# Patient Record
Sex: Female | Born: 1935 | Race: White | Hispanic: No
Health system: Southern US, Community
[De-identification: ages and names within clinical notes are randomized; demographics above are authoritative.]

## PROBLEM LIST (undated history)

## (undated) DIAGNOSIS — I10 Essential (primary) hypertension: Secondary | ICD-10-CM

## (undated) DIAGNOSIS — R413 Other amnesia: Secondary | ICD-10-CM

## (undated) HISTORY — DX: Other amnesia: R41.3

---

## 1999-01-27 ENCOUNTER — Ambulatory Visit (HOSPITAL_COMMUNITY): Admission: RE | Admit: 1999-01-27 | Discharge: 1999-01-27 | Payer: Self-pay | Admitting: Gastroenterology

## 1999-01-27 ENCOUNTER — Encounter (INDEPENDENT_AMBULATORY_CARE_PROVIDER_SITE_OTHER): Payer: Self-pay | Admitting: Specialist

## 1999-02-20 ENCOUNTER — Encounter: Payer: Self-pay | Admitting: Obstetrics and Gynecology

## 1999-02-20 ENCOUNTER — Encounter: Admission: RE | Admit: 1999-02-20 | Discharge: 1999-02-20 | Payer: Self-pay | Admitting: Obstetrics and Gynecology

## 1999-04-27 ENCOUNTER — Other Ambulatory Visit: Admission: RE | Admit: 1999-04-27 | Discharge: 1999-04-27 | Payer: Self-pay | Admitting: Obstetrics and Gynecology

## 1999-04-27 ENCOUNTER — Encounter (INDEPENDENT_AMBULATORY_CARE_PROVIDER_SITE_OTHER): Payer: Self-pay | Admitting: Specialist

## 1999-08-04 ENCOUNTER — Encounter: Admission: RE | Admit: 1999-08-04 | Discharge: 1999-08-04 | Payer: Self-pay | Admitting: Family Medicine

## 1999-08-04 ENCOUNTER — Encounter: Admission: RE | Admit: 1999-08-04 | Discharge: 1999-08-04 | Payer: Self-pay | Admitting: Obstetrics and Gynecology

## 1999-08-04 ENCOUNTER — Encounter: Payer: Self-pay | Admitting: Obstetrics and Gynecology

## 1999-08-04 ENCOUNTER — Encounter: Payer: Self-pay | Admitting: Family Medicine

## 2000-09-16 ENCOUNTER — Encounter: Payer: Self-pay | Admitting: Obstetrics and Gynecology

## 2000-09-16 ENCOUNTER — Encounter: Admission: RE | Admit: 2000-09-16 | Discharge: 2000-09-16 | Payer: Self-pay | Admitting: Obstetrics and Gynecology

## 2001-09-17 ENCOUNTER — Encounter: Payer: Self-pay | Admitting: Obstetrics and Gynecology

## 2001-09-17 ENCOUNTER — Encounter: Admission: RE | Admit: 2001-09-17 | Discharge: 2001-09-17 | Payer: Self-pay | Admitting: Obstetrics and Gynecology

## 2003-01-06 ENCOUNTER — Encounter: Admission: RE | Admit: 2003-01-06 | Discharge: 2003-01-06 | Payer: Self-pay | Admitting: Obstetrics and Gynecology

## 2003-01-06 ENCOUNTER — Encounter: Payer: Self-pay | Admitting: Obstetrics and Gynecology

## 2004-01-19 ENCOUNTER — Encounter: Admission: RE | Admit: 2004-01-19 | Discharge: 2004-01-19 | Payer: Self-pay | Admitting: Family Medicine

## 2005-04-17 ENCOUNTER — Encounter: Admission: RE | Admit: 2005-04-17 | Discharge: 2005-04-17 | Payer: Self-pay | Admitting: Family Medicine

## 2005-12-13 ENCOUNTER — Encounter: Admission: RE | Admit: 2005-12-13 | Discharge: 2005-12-13 | Payer: Self-pay | Admitting: Family Medicine

## 2006-06-20 ENCOUNTER — Encounter: Admission: RE | Admit: 2006-06-20 | Discharge: 2006-06-20 | Payer: Self-pay | Admitting: Family Medicine

## 2007-10-23 ENCOUNTER — Encounter: Admission: RE | Admit: 2007-10-23 | Discharge: 2007-10-23 | Payer: Self-pay | Admitting: Family Medicine

## 2009-01-27 ENCOUNTER — Encounter: Admission: RE | Admit: 2009-01-27 | Discharge: 2009-01-27 | Payer: Self-pay | Admitting: Family Medicine

## 2010-02-02 ENCOUNTER — Encounter: Admission: RE | Admit: 2010-02-02 | Discharge: 2010-02-02 | Payer: Self-pay | Admitting: Family Medicine

## 2011-05-16 ENCOUNTER — Other Ambulatory Visit: Payer: Self-pay | Admitting: Family Medicine

## 2011-05-16 DIAGNOSIS — Z1231 Encounter for screening mammogram for malignant neoplasm of breast: Secondary | ICD-10-CM

## 2011-05-31 ENCOUNTER — Ambulatory Visit
Admission: RE | Admit: 2011-05-31 | Discharge: 2011-05-31 | Disposition: A | Discharge status: Home / Self Care | Payer: Medicare Other | Source: Ambulatory Visit | Attending: Family Medicine | Admitting: Family Medicine

## 2011-05-31 DIAGNOSIS — Z1231 Encounter for screening mammogram for malignant neoplasm of breast: Secondary | ICD-10-CM

## 2013-02-13 ENCOUNTER — Other Ambulatory Visit: Payer: Self-pay

## 2013-02-13 DIAGNOSIS — Z1231 Encounter for screening mammogram for malignant neoplasm of breast: Secondary | ICD-10-CM

## 2013-03-20 ENCOUNTER — Ambulatory Visit
Admission: RE | Admit: 2013-03-20 | Discharge: 2013-03-20 | Disposition: A | Discharge status: Home / Self Care | Payer: Medicare Other | Source: Ambulatory Visit

## 2013-03-20 DIAGNOSIS — Z1231 Encounter for screening mammogram for malignant neoplasm of breast: Secondary | ICD-10-CM

## 2014-04-20 ENCOUNTER — Other Ambulatory Visit: Payer: Self-pay | Admitting: Family Medicine

## 2014-04-20 DIAGNOSIS — Z1231 Encounter for screening mammogram for malignant neoplasm of breast: Secondary | ICD-10-CM

## 2014-04-20 DIAGNOSIS — M810 Age-related osteoporosis without current pathological fracture: Secondary | ICD-10-CM

## 2014-04-29 ENCOUNTER — Other Ambulatory Visit: Payer: Medicare Other

## 2014-04-29 ENCOUNTER — Ambulatory Visit: Payer: Medicare Other

## 2014-05-11 ENCOUNTER — Ambulatory Visit: Payer: Medicare Other

## 2014-05-11 ENCOUNTER — Other Ambulatory Visit: Payer: Medicare Other

## 2014-05-25 ENCOUNTER — Ambulatory Visit: Payer: Medicare Other

## 2014-05-25 ENCOUNTER — Other Ambulatory Visit: Payer: Medicare Other

## 2014-09-29 ENCOUNTER — Ambulatory Visit
Admission: RE | Admit: 2014-09-29 | Discharge: 2014-09-29 | Disposition: A | Discharge status: Home / Self Care | Payer: Medicare HMO | Source: Ambulatory Visit | Attending: Family Medicine | Admitting: Family Medicine

## 2014-09-29 DIAGNOSIS — Z1231 Encounter for screening mammogram for malignant neoplasm of breast: Secondary | ICD-10-CM

## 2014-09-29 DIAGNOSIS — M810 Age-related osteoporosis without current pathological fracture: Secondary | ICD-10-CM

## 2016-09-17 ENCOUNTER — Emergency Department (HOSPITAL_COMMUNITY): Payer: Medicare HMO

## 2016-09-17 ENCOUNTER — Encounter (HOSPITAL_COMMUNITY): Payer: Self-pay | Admitting: Emergency Medicine

## 2016-09-17 ENCOUNTER — Inpatient Hospital Stay (HOSPITAL_COMMUNITY)
Admission: EM | Admit: 2016-09-17 | Discharge: 2016-09-20 | DRG: 481 | Disposition: A | Discharge status: Skilled Nursing Facility | Payer: Medicare HMO | Attending: Internal Medicine | Admitting: Internal Medicine

## 2016-09-17 DIAGNOSIS — M25552 Pain in left hip: Secondary | ICD-10-CM

## 2016-09-17 DIAGNOSIS — I10 Essential (primary) hypertension: Secondary | ICD-10-CM | POA: Diagnosis present

## 2016-09-17 DIAGNOSIS — Z01811 Encounter for preprocedural respiratory examination: Secondary | ICD-10-CM

## 2016-09-17 DIAGNOSIS — Z09 Encounter for follow-up examination after completed treatment for conditions other than malignant neoplasm: Secondary | ICD-10-CM

## 2016-09-17 DIAGNOSIS — S72002A Fracture of unspecified part of neck of left femur, initial encounter for closed fracture: Secondary | ICD-10-CM

## 2016-09-17 DIAGNOSIS — Z8249 Family history of ischemic heart disease and other diseases of the circulatory system: Secondary | ICD-10-CM

## 2016-09-17 DIAGNOSIS — K5900 Constipation, unspecified: Secondary | ICD-10-CM | POA: Diagnosis present

## 2016-09-17 DIAGNOSIS — D649 Anemia, unspecified: Secondary | ICD-10-CM | POA: Diagnosis present

## 2016-09-17 DIAGNOSIS — W010XXA Fall on same level from slipping, tripping and stumbling without subsequent striking against object, initial encounter: Secondary | ICD-10-CM | POA: Diagnosis present

## 2016-09-17 DIAGNOSIS — Y93H2 Activity, gardening and landscaping: Secondary | ICD-10-CM

## 2016-09-17 DIAGNOSIS — S72142A Displaced intertrochanteric fracture of left femur, initial encounter for closed fracture: Secondary | ICD-10-CM | POA: Diagnosis not present

## 2016-09-17 DIAGNOSIS — Y92007 Garden or yard of unspecified non-institutional (private) residence as the place of occurrence of the external cause: Secondary | ICD-10-CM

## 2016-09-17 DIAGNOSIS — D62 Acute posthemorrhagic anemia: Secondary | ICD-10-CM | POA: Diagnosis not present

## 2016-09-17 HISTORY — DX: Essential (primary) hypertension: I10

## 2016-09-17 LAB — COMPREHENSIVE METABOLIC PANEL
ALBUMIN: 4 g/dL (ref 3.5–5.0)
ALK PHOS: 84 U/L (ref 38–126)
ALT: 24 U/L (ref 14–54)
ANION GAP: 6 (ref 5–15)
AST: 30 U/L (ref 15–41)
BILIRUBIN TOTAL: 0.2 mg/dL — AB (ref 0.3–1.2)
BUN: 12 mg/dL (ref 6–20)
CALCIUM: 8.9 mg/dL (ref 8.9–10.3)
CO2: 25 mmol/L (ref 22–32)
CREATININE: 0.7 mg/dL (ref 0.44–1.00)
Chloride: 108 mmol/L (ref 101–111)
GFR calc non Af Amer: >60 mL/min (ref >60)
GLUCOSE: 137 mg/dL — AB (ref 65–99)
Potassium: 3.8 mmol/L (ref 3.5–5.1)
Sodium: 139 mmol/L (ref 135–145)
TOTAL PROTEIN: 7.5 g/dL (ref 6.5–8.1)

## 2016-09-17 LAB — CBC WITH DIFFERENTIAL/PLATELET
BASOS ABS: 0 10*3/uL (ref 0.0–0.1)
BASOS PCT: 0 %
EOS ABS: 0 10*3/uL (ref 0.0–0.7)
EOS PCT: 0 %
HEMATOCRIT: 41.7 % (ref 36.0–46.0)
Hemoglobin: 14.6 g/dL (ref 12.0–15.0)
Lymphocytes Relative: 4 %
Lymphs Abs: 0.9 10*3/uL (ref 0.7–4.0)
MCH: 32.9 pg (ref 26.0–34.0)
MCHC: 35 g/dL (ref 30.0–36.0)
MCV: 93.9 fL (ref 78.0–100.0)
MONO ABS: 1.5 10*3/uL — AB (ref 0.1–1.0)
Monocytes Relative: 7 %
NEUTROS ABS: 19.1 10*3/uL — AB (ref 1.7–7.7)
Neutrophils Relative %: 89 %
PLATELETS: UNDETERMINED 10*3/uL (ref 150–400)
RBC: 4.44 MIL/uL (ref 3.87–5.11)
RDW: 12.5 % (ref 11.5–15.5)
WBC: 21.5 10*3/uL — ABNORMAL HIGH (ref 4.0–10.5)

## 2016-09-17 MED ORDER — MORPHINE SULFATE (PF) 2 MG/ML IV SOLN
4.0000 mg | Freq: Once | INTRAVENOUS | Status: AC
  Administered 2016-09-17: 4 mg via INTRAVENOUS
  Filled 2016-09-17: qty 2

## 2016-09-17 NOTE — ED Notes (Signed)
Patient transported to X-ray 

## 2016-09-17 NOTE — ED Provider Notes (Addendum)
[facility]-EMERGENCY DEPT Provider Note   CSN: [id number] Arrival date & time: 09/17/16  2014     History   Chief Complaint Chief Complaint  Patient presents with  . Hip Injury    left    HPI Kelly [name] is a 82 y.o. female.  She presents for evaluation of left hip pain following fall.  She describes working outside in her garden, stepping on something that she did not see, then falling injuring her left hip.  Follow the fall she was unable to get up.  She denies other injuries.  There were no preceding symptoms.  She denies recent illnesses.  There are no other known modifying factors.  HPI  Past Medical History:  Diagnosis Date  . Hypertension     There are no active problems to display for this patient.   History reviewed. No pertinent surgical history.  OB History    No data available       Home Medications    Prior to Admission medications   Medication Sig Start Date End Date Taking? Authorizing Provider  Ascorbic Acid (VITAMIN C) 100 MG tablet Take 100 mg by mouth daily.    Yes [provider]  cholecalciferol (VITAMIN D) 1000 units tablet Take 1,000 Units by mouth daily.   Yes [provider]  valsartan (DIOVAN) 80 MG tablet TAKE 1 TABLET (80 MG TOTAL) BY MOUTH DAILY. 08/04/16  Yes [provider]    Family History No family history on file.  Social History Social History  Substance Use Topics  . Smoking status: Passive Smoke Exposure - Never Smoker  . Smokeless tobacco: Never Used  . Alcohol use No     Allergies   Patient has no known allergies.   Review of Systems Review of Systems  All other systems reviewed and are negative.    Physical Exam Updated Vital Signs BP (!) 141/78   Pulse 66   Temp 97.7 F (36.5 C) (Oral)   Resp 11   Ht 5\' 6"  (1.676 m)   Wt 63.5 kg (140 lb)   SpO2 91%   BMI 22.60 kg/m   Physical Exam  Constitutional: She is oriented to person, place, and time. She appears well-developed.  No distress.  Elderly, frail  HENT:  Head: Normocephalic and atraumatic.  Eyes: Conjunctivae and EOM are normal. Pupils are equal, round, and reactive to light.  Neck: Normal range of motion and phonation normal. Neck supple.  Cardiovascular: Normal rate and regular rhythm.   Pulmonary/Chest: Effort normal and breath sounds normal. She exhibits no tenderness.  Abdominal: Soft. She exhibits no distension. There is no tenderness. There is no guarding.  Musculoskeletal:  Left leg externally rotated and shortened.  Tender left hip, resisting motion at the site secondary to pain.  Neurological: She is alert and oriented to person, place, and time. She exhibits normal muscle tone.  Skin: Skin is warm and dry.  Psychiatric: She has a normal mood and affect. Her behavior is normal. Judgment and thought content normal.  Nursing note and vitals reviewed.    ED Treatments / Results  Labs (all labs ordered are listed, but only abnormal results are displayed) Labs Reviewed  COMPREHENSIVE METABOLIC PANEL - Abnormal; Notable for the following:       Result Value   Glucose, Bld 137 (*)    Total Bilirubin 0.2 (*)    All other components within normal limits  CBC WITH DIFFERENTIAL/PLATELET - Abnormal; Notable for the following:    WBC  21.5 (*)    Neutro Abs 19.1 (*)    Monocytes Absolute 1.5 (*)    All other components within normal limits    EKG  EKG Interpretation  Date/Time:  Monday September 17 2016 22:22:32 EDT Ventricular Rate:  60 PR Interval:    QRS Duration: 134 QT Interval:  439 QTC Calculation: 439 R Axis:   23 Text Interpretation:  Sinus rhythm Right bundle branch block No old tracing to compare Confirmed by Mancel Bale [phone]) on 09/18/2016 12:00:51 AM       Radiology Dg Chest 1 View  Result Date: 09/17/2016 CLINICAL DATA:  Fall with hip fracture EXAM: CHEST 1 VIEW COMPARISON:  None. FINDINGS: No acute pulmonary infiltrate, consolidation or effusion. Heart size upper  limits normal. Mild aortic atherosclerosis. No pneumothorax. IMPRESSION: No active disease. Electronically Signed   By: Jasmine Pang M.D.   On: 09/17/2016 21:22   Dg Hip Unilat With Pelvis 2-3 Views Left  Result Date: 09/17/2016 CLINICAL DATA:  Fall with pain to the hip EXAM: DG HIP (WITH OR WITHOUT PELVIS) 2-3V LEFT COMPARISON:  None. FINDINGS: Sclerosis of the pubic symphysis. SI joints do not appear widened. Mild arthritis right hip. The pubic rami appear intact. Comminuted intertrochanteric fracture on the left with centrally displaced lesser trochanteric fracture fragment toward the midline. Femoral head is not dislocated. IMPRESSION: Acute, comminuted and slightly distracted intertrochanteric fracture on the left. Electronically Signed   By: Jasmine Pang M.D.   On: 09/17/2016 21:22   Dg Femur Min 2 Views Left  Result Date: 09/17/2016 CLINICAL DATA:  Status post fall at home, with left hip pain. Initial encounter. EXAM: LEFT FEMUR 2 VIEWS COMPARISON:  None. FINDINGS: There is a comminuted left femoral intertrochanteric fracture, with a displaced lesser trochanteric fragment, and medial angulation of the distal femur. The left femoral head remains seated at the acetabulum. Mild degenerative change is noted at the pubic symphysis. The distal femur appears intact. No definite knee joint effusion is identified. IMPRESSION: Comminuted left femoral intertrochanteric fracture, with displaced lesser trochanteric fragment, and medial angulation of the distal femur. Electronically Signed   By: Roanna Raider M.D.   On: 09/17/2016 23:28    Procedures Procedures (including critical care time)  Medications Ordered in ED Medications  morphine 2 MG/ML injection 4 mg (4 mg Intravenous Given 09/17/16 2042)     Initial Impression / Assessment and Plan / ED Course  I have reviewed the triage vital signs and the nursing notes.  Pertinent labs & imaging results that were available during my care of the  patient were reviewed by me and considered in my medical decision making (see chart for details).  Clinical Course as of Jun 12 0001  Mon Sep 17, 2016  2140 DG Hip Unilat With Pelvis 2-3 Views Left [EW]  2305 Return call received from orthopedics Veda Canning), who will evaluate the patient, tomorrow and likely seed with operative repair of left hip fracture.  [EW]    Clinical Course User Index [EW] Mancel Bale, MD     Patient Vitals for the past 24 hrs:  BP Temp Temp src Pulse Resp SpO2 Height Weight  09/17/16 2330 (!) 141/78 - - 66 11 91 % - -  09/17/16 2300 124/67 - - 63 17 92 % - -  09/17/16 2230 135/73 - - 60 (!) 22 93 % - -  09/17/16 2200 135/69 - - 63 (!) 27 90 % - -  09/17/16 2145 (!) 147/71 - - 62 14  95 % - -  09/17/16 2141 140/68 - - 62 18 95 % - -  09/17/16 2023 - - - - - - 5\' 6"  (1.676 m) 63.5 kg (140 lb)  09/17/16 2022 (!) 148/84 97.7 F (36.5 C) Oral 60 16 95 % - -    11:05 PM Reevaluation with update and discussion. After initial assessment and treatment, an updated evaluation reveals she remains fairly comfortable at this time.  Repeat x-rays ordered at request of orthopedist. Enza Shone L   11:06 PM-Consult complete with hospitalist. Patient case explained and discussed.  He agrees to admit patient for further evaluation and treatment. Call ended at 23: 20  Final Clinical Impressions(s) / ED Diagnoses   Final diagnoses:  Closed fracture of left hip, initial encounter (HCC)   Left hip fracture secondary to mechanical fall.  She will require hospitalization for operative management of the fracture.  Nursing Notes Reviewed/ Care Coordinated Applicable Imaging Reviewed Interpretation of Laboratory Data incorporated into ED treatment   Plan: Admit   New Prescriptions New Prescriptions   No medications on file     Mancel BaleWentz, Jakalyn Kratky, MD 09/17/16 [id number]    Mancel BaleWentz, Latha Staunton, MD 09/18/16 0001

## 2016-09-17 NOTE — ED Triage Notes (Addendum)
Per [facility] pt from home, was working in her garden and tripped. Pt c/o left hip pain. Shortening noted to left leg, pt unable to lay leg flat. Sheet used to hold pelvis tight. 100 mcg fentanyl given en route.

## 2016-09-18 ENCOUNTER — Inpatient Hospital Stay (HOSPITAL_COMMUNITY): Payer: Medicare HMO | Admitting: Anesthesiology

## 2016-09-18 ENCOUNTER — Inpatient Hospital Stay (HOSPITAL_COMMUNITY): Payer: Medicare HMO

## 2016-09-18 ENCOUNTER — Encounter (HOSPITAL_COMMUNITY): Payer: Self-pay | Admitting: Internal Medicine

## 2016-09-18 ENCOUNTER — Encounter (HOSPITAL_COMMUNITY)
Admission: EM | Disposition: A | Discharge status: Skilled Nursing Facility | Payer: Self-pay | Source: Home / Self Care | Attending: Internal Medicine

## 2016-09-18 DIAGNOSIS — W010XXA Fall on same level from slipping, tripping and stumbling without subsequent striking against object, initial encounter: Secondary | ICD-10-CM | POA: Diagnosis present

## 2016-09-18 DIAGNOSIS — S72142A Displaced intertrochanteric fracture of left femur, initial encounter for closed fracture: Secondary | ICD-10-CM | POA: Diagnosis present

## 2016-09-18 DIAGNOSIS — K5900 Constipation, unspecified: Secondary | ICD-10-CM | POA: Diagnosis not present

## 2016-09-18 DIAGNOSIS — D62 Acute posthemorrhagic anemia: Secondary | ICD-10-CM | POA: Diagnosis not present

## 2016-09-18 DIAGNOSIS — I10 Essential (primary) hypertension: Secondary | ICD-10-CM | POA: Diagnosis not present

## 2016-09-18 DIAGNOSIS — Y93H2 Activity, gardening and landscaping: Secondary | ICD-10-CM | POA: Diagnosis not present

## 2016-09-18 DIAGNOSIS — S72002A Fracture of unspecified part of neck of left femur, initial encounter for closed fracture: Secondary | ICD-10-CM | POA: Diagnosis not present

## 2016-09-18 DIAGNOSIS — D649 Anemia, unspecified: Secondary | ICD-10-CM | POA: Diagnosis not present

## 2016-09-18 DIAGNOSIS — Z8249 Family history of ischemic heart disease and other diseases of the circulatory system: Secondary | ICD-10-CM | POA: Diagnosis not present

## 2016-09-18 DIAGNOSIS — M25552 Pain in left hip: Secondary | ICD-10-CM | POA: Diagnosis not present

## 2016-09-18 DIAGNOSIS — Y92007 Garden or yard of unspecified non-institutional (private) residence as the place of occurrence of the external cause: Secondary | ICD-10-CM | POA: Diagnosis not present

## 2016-09-18 HISTORY — PX: INTRAMEDULLARY (IM) NAIL INTERTROCHANTERIC: SHX5875

## 2016-09-18 LAB — SURGICAL PCR SCREEN
MRSA, PCR: NEGATIVE
STAPHYLOCOCCUS AUREUS: NEGATIVE

## 2016-09-18 SURGERY — FIXATION, FRACTURE, INTERTROCHANTERIC, WITH INTRAMEDULLARY ROD
Anesthesia: General | Site: Hip | Laterality: Left

## 2016-09-18 MED ORDER — ISOPROPYL ALCOHOL 70 % SOLN
Status: DC | PRN
  Administered 2016-09-18: 1 via TOPICAL

## 2016-09-18 MED ORDER — ONDANSETRON HCL 4 MG PO TABS: 4.0000 mg | ORAL_TABLET | Freq: Four times a day (QID) | ORAL | Status: DC | PRN

## 2016-09-18 MED ORDER — POVIDONE-IODINE 10 % EX SWAB: 2.0000 "application " | Freq: Once | CUTANEOUS | Status: DC

## 2016-09-18 MED ORDER — ENOXAPARIN SODIUM 40 MG/0.4ML ~~LOC~~ SOLN
40.0000 mg | SUBCUTANEOUS | Status: DC
  Administered 2016-09-19 – 2016-09-20 (×2): 40 mg via SUBCUTANEOUS
  Filled 2016-09-18 (×2): qty 0.4

## 2016-09-18 MED ORDER — PHENOL 1.4 % MT LIQD: 1.0000 | OROMUCOSAL | Status: DC | PRN

## 2016-09-18 MED ORDER — VITAMIN D3 25 MCG (1000 UNIT) PO TABS
1000.0000 [IU] | ORAL_TABLET | Freq: Every day | ORAL | Status: DC
  Administered 2016-09-18 – 2016-09-20 (×3): 1000 [IU] via ORAL
  Filled 2016-09-18 (×3): qty 1

## 2016-09-18 MED ORDER — ALUM & MAG HYDROXIDE-SIMETH 200-200-20 MG/5ML PO SUSP: 15.0000 mL | ORAL | Status: DC | PRN

## 2016-09-18 MED ORDER — FENTANYL CITRATE (PF) 250 MCG/5ML IJ SOLN
INTRAMUSCULAR | Status: AC
  Filled 2016-09-18: qty 5

## 2016-09-18 MED ORDER — PROMETHAZINE HCL 25 MG/ML IJ SOLN: 6.2500 mg | INTRAMUSCULAR | Status: DC | PRN

## 2016-09-18 MED ORDER — MORPHINE SULFATE (PF) 2 MG/ML IV SOLN: 0.5000 mg | INTRAVENOUS | Status: DC | PRN

## 2016-09-18 MED ORDER — CEFAZOLIN SODIUM-DEXTROSE 2-4 GM/100ML-% IV SOLN
INTRAVENOUS | Status: AC
Start: 2016-09-18 — End: 2016-09-18
  Filled 2016-09-18: qty 100

## 2016-09-18 MED ORDER — HYDROMORPHONE HCL 1 MG/ML IJ SOLN
0.2500 mg | INTRAMUSCULAR | Status: DC | PRN
  Administered 2016-09-18 (×4): 0.5 mg via INTRAVENOUS

## 2016-09-18 MED ORDER — METOCLOPRAMIDE HCL 5 MG/ML IJ SOLN: 5.0000 mg | Freq: Three times a day (TID) | INTRAMUSCULAR | Status: DC | PRN

## 2016-09-18 MED ORDER — VITAMIN C 250 MG PO TABS
250.0000 mg | ORAL_TABLET | Freq: Every day | ORAL | Status: DC
  Administered 2016-09-18 – 2016-09-20 (×3): 250 mg via ORAL
  Filled 2016-09-18 (×3): qty 1

## 2016-09-18 MED ORDER — HYDROCODONE-ACETAMINOPHEN 5-325 MG PO TABS
1.0000 | ORAL_TABLET | Freq: Four times a day (QID) | ORAL | Status: DC | PRN
  Administered 2016-09-18: 1 via ORAL
  Administered 2016-09-19 (×3): 2 via ORAL
  Administered 2016-09-19: 05:00:00 1 via ORAL
  Administered 2016-09-20: 05:00:00 2 via ORAL
  Filled 2016-09-18 (×2): qty 2
  Filled 2016-09-18: qty 1
  Filled 2016-09-18 (×2): qty 2
  Filled 2016-09-18: qty 1

## 2016-09-18 MED ORDER — ACETAMINOPHEN 325 MG PO TABS: 650.0000 mg | ORAL_TABLET | Freq: Four times a day (QID) | ORAL | Status: DC | PRN

## 2016-09-18 MED ORDER — LIDOCAINE 2% (20 MG/ML) 5 ML SYRINGE
INTRAMUSCULAR | Status: DC | PRN
  Administered 2016-09-18: 60 mg via INTRAVENOUS

## 2016-09-18 MED ORDER — METOCLOPRAMIDE HCL 5 MG PO TABS: 5.0000 mg | ORAL_TABLET | Freq: Three times a day (TID) | ORAL | Status: DC | PRN

## 2016-09-18 MED ORDER — HYDROCODONE-ACETAMINOPHEN 5-325 MG PO TABS: 1.0000 | ORAL_TABLET | Freq: Four times a day (QID) | ORAL | Status: DC | PRN

## 2016-09-18 MED ORDER — ONDANSETRON HCL 4 MG/2ML IJ SOLN
INTRAMUSCULAR | Status: DC | PRN
  Administered 2016-09-18: 4 mg via INTRAVENOUS

## 2016-09-18 MED ORDER — MORPHINE SULFATE (PF) 2 MG/ML IV SOLN
0.5000 mg | INTRAVENOUS | Status: DC | PRN
Start: 2016-09-18 — End: 2016-09-20

## 2016-09-18 MED ORDER — SUGAMMADEX SODIUM 200 MG/2ML IV SOLN
INTRAVENOUS | Status: AC
  Filled 2016-09-18: qty 2

## 2016-09-18 MED ORDER — MORPHINE SULFATE (PF) 2 MG/ML IV SOLN
2.0000 mg | INTRAVENOUS | Status: DC | PRN
  Administered 2016-09-18 (×2): 4 mg via INTRAVENOUS
  Filled 2016-09-18 (×2): qty 2

## 2016-09-18 MED ORDER — IRBESARTAN 75 MG PO TABS
75.0000 mg | ORAL_TABLET | Freq: Every day | ORAL | Status: DC
  Administered 2016-09-19 – 2016-09-20 (×2): 75 mg via ORAL
  Filled 2016-09-18 (×2): qty 1

## 2016-09-18 MED ORDER — PROPOFOL 10 MG/ML IV BOLUS
INTRAVENOUS | Status: DC | PRN
  Administered 2016-09-18: 130 mg via INTRAVENOUS

## 2016-09-18 MED ORDER — CHLORHEXIDINE GLUCONATE 4 % EX LIQD: 60.0000 mL | Freq: Once | CUTANEOUS | Status: DC

## 2016-09-18 MED ORDER — PROPOFOL 10 MG/ML IV BOLUS
INTRAVENOUS | Status: AC
  Filled 2016-09-18: qty 20

## 2016-09-18 MED ORDER — CEFAZOLIN SODIUM-DEXTROSE 2-4 GM/100ML-% IV SOLN
2.0000 g | INTRAVENOUS | Status: AC
  Administered 2016-09-18: 2 g via INTRAVENOUS

## 2016-09-18 MED ORDER — ONDANSETRON HCL 4 MG/2ML IJ SOLN: 4.0000 mg | Freq: Four times a day (QID) | INTRAMUSCULAR | Status: DC | PRN

## 2016-09-18 MED ORDER — FENTANYL CITRATE (PF) 100 MCG/2ML IJ SOLN
INTRAMUSCULAR | Status: DC | PRN
  Administered 2016-09-18 (×2): 25 ug via INTRAVENOUS
  Administered 2016-09-18 (×2): 50 ug via INTRAVENOUS

## 2016-09-18 MED ORDER — DEXAMETHASONE SODIUM PHOSPHATE 10 MG/ML IJ SOLN
INTRAMUSCULAR | Status: AC
  Filled 2016-09-18: qty 1

## 2016-09-18 MED ORDER — STERILE WATER FOR IRRIGATION IR SOLN
Status: DC | PRN
  Administered 2016-09-18: 2000 mL

## 2016-09-18 MED ORDER — HYDROMORPHONE HCL 1 MG/ML IJ SOLN
INTRAMUSCULAR | Status: AC
  Filled 2016-09-18: qty 2

## 2016-09-18 MED ORDER — ACETAMINOPHEN 650 MG RE SUPP: 650.0000 mg | Freq: Four times a day (QID) | RECTAL | Status: DC | PRN

## 2016-09-18 MED ORDER — DEXAMETHASONE SODIUM PHOSPHATE 10 MG/ML IJ SOLN
INTRAMUSCULAR | Status: DC | PRN
  Administered 2016-09-18: 10 mg via INTRAVENOUS

## 2016-09-18 MED ORDER — ONDANSETRON HCL 4 MG/2ML IJ SOLN
INTRAMUSCULAR | Status: AC
  Filled 2016-09-18: qty 2

## 2016-09-18 MED ORDER — LIDOCAINE 2% (20 MG/ML) 5 ML SYRINGE
INTRAMUSCULAR | Status: AC
  Filled 2016-09-18: qty 5

## 2016-09-18 MED ORDER — SODIUM CHLORIDE 0.9 % IV SOLN
INTRAVENOUS | Status: DC
  Administered 2016-09-18 – 2016-09-19 (×2): via INTRAVENOUS

## 2016-09-18 MED ORDER — ROCURONIUM BROMIDE 10 MG/ML (PF) SYRINGE
PREFILLED_SYRINGE | INTRAVENOUS | Status: DC | PRN
  Administered 2016-09-18: 50 mg via INTRAVENOUS

## 2016-09-18 MED ORDER — ROCURONIUM BROMIDE 50 MG/5ML IV SOSY
PREFILLED_SYRINGE | INTRAVENOUS | Status: AC
  Filled 2016-09-18: qty 5

## 2016-09-18 MED ORDER — SUGAMMADEX SODIUM 200 MG/2ML IV SOLN
INTRAVENOUS | Status: DC | PRN
  Administered 2016-09-18: 150 mg via INTRAVENOUS

## 2016-09-18 MED ORDER — MENTHOL 3 MG MT LOZG: 1.0000 | LOZENGE | OROMUCOSAL | Status: DC | PRN

## 2016-09-18 MED ORDER — SODIUM CHLORIDE 0.9 % IR SOLN
Status: DC | PRN
  Administered 2016-09-18: 1000 mL

## 2016-09-18 MED ORDER — POLYETHYLENE GLYCOL 3350 17 G PO PACK
17.0000 g | PACK | Freq: Every day | ORAL | Status: DC
  Administered 2016-09-19 – 2016-09-20 (×2): 17 g via ORAL
  Filled 2016-09-18 (×2): qty 1

## 2016-09-18 MED ORDER — LACTATED RINGERS IV SOLN
INTRAVENOUS | Status: DC
  Administered 2016-09-18 (×2): via INTRAVENOUS

## 2016-09-18 SURGICAL SUPPLY — 44 items
ADH SKN CLS APL DERMABOND .7 (GAUZE/BANDAGES/DRESSINGS) ×2
BAG SPEC THK2 15X12 ZIP CLS (MISCELLANEOUS)
BAG ZIPLOCK 12X15 (MISCELLANEOUS) IMPLANT
BIT DRILL 4.3MMS DISTAL GRDTED (BIT) IMPLANT
CHLORAPREP W/TINT 26ML (MISCELLANEOUS) ×3 IMPLANT
COVER PERINEAL POST (MISCELLANEOUS) ×3 IMPLANT
COVER SURGICAL LIGHT HANDLE (MISCELLANEOUS) ×3 IMPLANT
DERMABOND ADVANCED (GAUZE/BANDAGES/DRESSINGS) ×4
DERMABOND ADVANCED .7 DNX12 (GAUZE/BANDAGES/DRESSINGS) ×2 IMPLANT
DRAPE C-ARM 42X120 X-RAY (DRAPES) ×3 IMPLANT
DRAPE C-ARMOR (DRAPES) ×3 IMPLANT
DRAPE SHEET LG 3/4 BI-LAMINATE (DRAPES) ×3 IMPLANT
DRAPE STERI IOBAN 125X83 (DRAPES) ×3 IMPLANT
DRAPE U-SHAPE 47X51 STRL (DRAPES) ×6 IMPLANT
DRILL 4.3MMS DISTAL GRADUATED (BIT) ×3
DRSG MEPILEX BORDER 4X4 (GAUZE/BANDAGES/DRESSINGS) ×8 IMPLANT
DRSG MEPILEX BORDER 4X8 (GAUZE/BANDAGES/DRESSINGS) IMPLANT
ELECT BLADE TIP CTD 4 INCH (ELECTRODE) IMPLANT
FACESHIELD WRAPAROUND (MASK) ×3 IMPLANT
FACESHIELD WRAPAROUND OR TEAM (MASK) ×1 IMPLANT
GAUZE SPONGE 4X4 12PLY STRL (GAUZE/BANDAGES/DRESSINGS) ×3 IMPLANT
GLOVE BIO SURGEON STRL SZ8.5 (GLOVE) ×6 IMPLANT
GLOVE BIOGEL M STRL SZ7.5 (GLOVE) ×2 IMPLANT
GLOVE BIOGEL PI IND STRL 7.5 (GLOVE) IMPLANT
GLOVE BIOGEL PI IND STRL 8.5 (GLOVE) ×1 IMPLANT
GLOVE BIOGEL PI INDICATOR 7.5 (GLOVE) ×2
GLOVE BIOGEL PI INDICATOR 8.5 (GLOVE) ×2
GOWN SPEC L3 XXLG W/TWL (GOWN DISPOSABLE) ×5 IMPLANT
GUIDEPIN 3.2X17.5 THRD DISP (PIN) ×2 IMPLANT
GUIDEWIRE BALL NOSE 100CM (WIRE) ×2 IMPLANT
HIP FRAC NAIL LAG SCR 10.5X100 (Orthopedic Implant) ×2 IMPLANT
KIT BASIN OR (CUSTOM PROCEDURE TRAY) ×3 IMPLANT
MANIFOLD NEPTUNE II (INSTRUMENTS) ×3 IMPLANT
MARKER SKIN DUAL TIP RULER LAB (MISCELLANEOUS) ×3 IMPLANT
NAIL HIP FRACT LT 130D 11X400 (Nail) ×2 IMPLANT
PACK TOTAL JOINT WL LF (CUSTOM PROCEDURE TRAY) ×3 IMPLANT
SCREW BONE CORTICAL 5.0X42 (Screw) ×2 IMPLANT
SCREW CANN THRD AFF 10.5X100 (Orthopedic Implant) IMPLANT
SCREWDRIVER HEX TIP 3.5MM (MISCELLANEOUS) ×2 IMPLANT
SUT MNCRL AB 3-0 PS2 18 (SUTURE) ×3 IMPLANT
SUT VIC AB 1 CT1 36 (SUTURE) ×3 IMPLANT
SUT VIC AB 2-0 CT1 27 (SUTURE) ×3
SUT VIC AB 2-0 CT1 TAPERPNT 27 (SUTURE) IMPLANT
YANKAUER SUCT BULB TIP NO VENT (SUCTIONS) IMPLANT

## 2016-09-18 NOTE — Anesthesia Preprocedure Evaluation (Signed)
Anesthesia Evaluation  Patient identified by MRN, date of birth, ID band Patient awake    Reviewed: Allergy & Precautions, NPO status , Patient's Chart, lab work & pertinent test results  Airway Mallampati: II  TM Distance: >3 FB Neck ROM: Full    Dental no notable dental hx.    Pulmonary neg pulmonary ROS,    Pulmonary exam normal breath sounds clear to auscultation       Cardiovascular hypertension, Normal cardiovascular exam Rhythm:Regular Rate:Normal     Neuro/Psych negative neurological ROS  negative psych ROS   GI/Hepatic negative GI ROS, Neg liver ROS,   Endo/Other  negative endocrine ROS  Renal/GU negative Renal ROS  negative genitourinary   Musculoskeletal negative musculoskeletal ROS (+)   Abdominal   Peds negative pediatric ROS (+)  Hematology negative hematology ROS (+)   Anesthesia Other Findings   Reproductive/Obstetrics negative OB ROS                             Anesthesia Physical Anesthesia Plan  ASA: II  Anesthesia Plan: General   Post-op Pain Management:    Induction: Intravenous  PONV Risk Score and Plan: 2 and Ondansetron and Dexamethasone  Airway Management Planned: Oral ETT  Additional Equipment:   Intra-op Plan:   Post-operative Plan: Extubation in OR  Informed Consent: I have reviewed the patients History and Physical, chart, labs and discussed the procedure including the risks, benefits and alternatives for the proposed anesthesia with the patient or authorized representative who has indicated his/her understanding and acceptance.   Dental advisory given  Plan Discussed with: CRNA and Surgeon  Anesthesia Plan Comments:         Anesthesia Quick Evaluation  

## 2016-09-18 NOTE — ED Notes (Signed)
Provided patient water with permission from Dr. Julian ReilGardner.

## 2016-09-18 NOTE — Progress Notes (Signed)
X-rays reviewed with no abnormalities noted

## 2016-09-18 NOTE — Anesthesia Procedure Notes (Signed)
Procedure Name: Intubation Date/Time: 09/18/2016 2:02 PM Performed by: Leroy LibmanEARDON, Bern Fare L Patient Re-evaluated:Patient Re-evaluated prior to inductionOxygen Delivery Method: Circle system utilized Preoxygenation: Pre-oxygenation with 100% oxygen Intubation Type: IV induction Ventilation: Mask ventilation without difficulty and Oral airway inserted - appropriate to patient size Laryngoscope Size: Hyacinth MeekerMiller and 2 Grade View: Grade I Tube type: Oral Tube size: 7.0 mm Number of attempts: 1 Airway Equipment and Method: Stylet Placement Confirmation: ETT inserted through vocal cords under direct vision,  positive ETCO2 and breath sounds checked- equal and bilateral Secured at: 21 cm Tube secured with: Tape Dental Injury: Teeth and Oropharynx as per pre-operative assessment

## 2016-09-18 NOTE — H&P (Signed)
History and Physical    Kelly [name] ZOX:[id number] DOB: [birth year] DOA: 09/17/2016  PCP: Patient, No Pcp Per  Patient coming from: Home  I have personally briefly reviewed patient's old medical records in [facility]  Chief Complaint: L hip pain  HPI: Kelly [name] is a 82 y.o. female with medical history significant of HTN.  Patient was out working in garden today, had fall, landed on L hip.  Immediate L hip pain post fall, worse with movement, unable to bear weight post fall.  Pain is severe.  Patient brought to ED via EMS.   ED Course: L hip intertrochanteric Fx.   Review of Systems: As per HPI otherwise 10 point review of systems negative.   Past Medical History:  Diagnosis Date  . Hypertension     History reviewed. No pertinent surgical history.   reports that she is a non-smoker but has been exposed to tobacco smoke. She has never used smokeless tobacco. She reports that she does not drink alcohol. Her drug history is not on file.  No Known Allergies  Family History  Problem Relation Age of Onset  . Hypertension Mother   . Hearing loss Father      Prior to Admission medications   Medication Sig Start Date End Date Taking? Authorizing Provider  Ascorbic Acid (VITAMIN C) 100 MG tablet Take 100 mg by mouth daily.    Yes [provider]  cholecalciferol (VITAMIN D) 1000 units tablet Take 1,000 Units by mouth daily.   Yes [provider]  valsartan (DIOVAN) 80 MG tablet TAKE 1 TABLET (80 MG TOTAL) BY MOUTH DAILY. 08/04/16  Yes [provider]    Physical Exam: Vitals:   09/17/16 2230 09/17/16 2300 09/17/16 2330 09/18/16 0000  BP: 135/73 124/67 (!) 141/78 (!) 148/74  Pulse: 60 63 66 69  Resp: (!) 22 17 11 11   Temp:      TempSrc:      SpO2: 93% 92% 91% 91%  Weight:      Height:        Constitutional: NAD, calm, comfortable Eyes: PERRL, lids and conjunctivae normal ENMT: Mucous membranes are moist. Posterior pharynx clear of  any exudate or lesions.Normal dentition.  Neck: normal, supple, no masses, no thyromegaly Respiratory: clear to auscultation bilaterally, no wheezing, no crackles. Normal respiratory effort. No accessory muscle use.  Cardiovascular: Regular rate and rhythm, no murmurs / rubs / gallops. No extremity edema. 2+ pedal pulses. No carotid bruits.  Abdomen: no tenderness, no masses palpated. No hepatosplenomegaly. Bowel sounds positive.  Musculoskeletal: L hip TTP Skin: no rashes, lesions, ulcers. No induration  Neurologic: CN 2-12 grossly intact. Sensation intact, DTR normal. Strength 5/5 in all 4.  Psychiatric: Normal judgment and insight. Alert and oriented x 3. Normal mood.    Labs on Admission: I have personally reviewed following labs and imaging studies  CBC:  Recent Labs Lab 09/17/16 2215  WBC 21.5*  NEUTROABS 19.1*  HGB 14.6  HCT 41.7  MCV 93.9  PLT PLATELET CLUMPS NOTED ON SMEAR, UNABLE TO ESTIMATE   Basic Metabolic Panel:  Recent Labs Lab 09/17/16 2215  NA 139  K 3.8  CL 108  CO2 25  GLUCOSE 137*  BUN 12  CREATININE 0.70  CALCIUM 8.9   GFR: Estimated Creatinine Clearance: 52.5 mL/min (by C-G formula based on SCr of 0.7 mg/dL). Liver Function Tests:  Recent Labs Lab 09/17/16 2215  AST 30  ALT 24  ALKPHOS 84  BILITOT 0.2*  PROT 7.5  ALBUMIN 4.0   No results for input(s): LIPASE, AMYLASE in the last 168 hours. No results for input(s): AMMONIA in the last 168 hours. Coagulation Profile: No results for input(s): INR, PROTIME in the last 168 hours. Cardiac Enzymes: No results for input(s): CKTOTAL, CKMB, CKMBINDEX, TROPONINI in the last 168 hours. BNP (last 3 results) No results for input(s): PROBNP in the last 8760 hours. HbA1C: No results for input(s): HGBA1C in the last 72 hours. CBG: No results for input(s): GLUCAP in the last 168 hours. Lipid Profile: No results for input(s): CHOL, HDL, LDLCALC, TRIG, CHOLHDL, LDLDIRECT in the last 72  hours. Thyroid Function Tests: No results for input(s): TSH, T4TOTAL, FREET4, T3FREE, THYROIDAB in the last 72 hours. Anemia Panel: No results for input(s): VITAMINB12, FOLATE, FERRITIN, TIBC, IRON, RETICCTPCT in the last 72 hours. Urine analysis: No results found for: COLORURINE, APPEARANCEUR, LABSPEC, PHURINE, GLUCOSEU, HGBUR, BILIRUBINUR, KETONESUR, PROTEINUR, UROBILINOGEN, NITRITE, LEUKOCYTESUR  Radiological Exams on Admission: Dg Chest 1 View  Result Date: 09/17/2016 CLINICAL DATA:  Fall with hip fracture EXAM: CHEST 1 VIEW COMPARISON:  None. FINDINGS: No acute pulmonary infiltrate, consolidation or effusion. Heart size upper limits normal. Mild aortic atherosclerosis. No pneumothorax. IMPRESSION: No active disease. Electronically Signed   By: Jasmine PangKim  Fujinaga M.D.   On: 09/17/2016 21:22   Dg Hip Unilat With Pelvis 2-3 Views Left  Result Date: 09/17/2016 CLINICAL DATA:  Fall with pain to the hip EXAM: DG HIP (WITH OR WITHOUT PELVIS) 2-3V LEFT COMPARISON:  None. FINDINGS: Sclerosis of the pubic symphysis. SI joints do not appear widened. Mild arthritis right hip. The pubic rami appear intact. Comminuted intertrochanteric fracture on the left with centrally displaced lesser trochanteric fracture fragment toward the midline. Femoral head is not dislocated. IMPRESSION: Acute, comminuted and slightly distracted intertrochanteric fracture on the left. Electronically Signed   By: Jasmine PangKim  Fujinaga M.D.   On: 09/17/2016 21:22   Dg Femur Min 2 Views Left  Result Date: 09/17/2016 CLINICAL DATA:  Status post fall at home, with left hip pain. Initial encounter. EXAM: LEFT FEMUR 2 VIEWS COMPARISON:  None. FINDINGS: There is a comminuted left femoral intertrochanteric fracture, with a displaced lesser trochanteric fragment, and medial angulation of the distal femur. The left femoral head remains seated at the acetabulum. Mild degenerative change is noted at the pubic symphysis. The distal femur appears intact. No  definite knee joint effusion is identified. IMPRESSION: Comminuted left femoral intertrochanteric fracture, with displaced lesser trochanteric fragment, and medial angulation of the distal femur. Electronically Signed   By: Roanna RaiderJeffery  Chang M.D.   On: 09/17/2016 23:28    EKG: Independently reviewed.  Assessment/Plan Active Problems:   Closed intertrochanteric fracture of left hip, initial encounter (HCC)    1. Closed intertrochanteric fx of L hip - 1. Hip fx pathway 2. [facility]: 1. NPO after 2am 2. SCDs only for DVT ppx 3. Morphine PRN pain  DVT prophylaxis: SCDs only Code Status: Full Family Communication: Son at bedside Disposition Plan: Likely rehab after admit Consults called: EDP called Ortho.  Pt on OR schedule for 1300 with Dr. Linna CapriceSwinteck Admission status: Admit to inpatient   Hillary BowGARDNER, Merle Whitehorn M. DO [facility] Pager [phone]  If 7AM-7PM, please contact day team taking care of patient [email or web address] Password [email or web address]  09/18/2016, 12:30 AM

## 2016-09-18 NOTE — Consult Note (Signed)
ORTHOPAEDIC CONSULTATION  REQUESTING PHYSICIAN: Rolly SalterPatel, Pranav M, MD  PCP:  Patient, No Pcp Per  Chief Complaint: Left intertrochanteric femur fracture  HPI: Kelly [name] is a 82 y.o. female who complains of left hip pain after she slipped and fell in her yard yesterday. Patient was pulling weeds in the yard, when she slipped and fell. She landed on her left hip. She had hip pain and inability to weight-bear. [facility] where x-rays revealed a displaced intertrochanteric femur fracture on the left side. She was admitted to the hospitalist service for perioperative risk stratification and medical optimization. Orthopedic consultation was placed for management of her left hip fracture. She lives alone at home. She normally does not use any assist devices. She denies other injuries.  Past Medical History:  Diagnosis Date  . Hypertension    History reviewed. No pertinent surgical history. Social History   Social History  . Marital status: Divorced    Spouse name: N/A  . Number of children: N/A  . Years of education: N/A   Social History Main Topics  . Smoking status: Passive Smoke Exposure - Never Smoker  . Smokeless tobacco: Never Used  . Alcohol use No  . Drug use: Unknown  . Sexual activity: Not Asked   Other Topics Concern  . None   Social History Narrative  . None   Family History  Problem Relation Age of Onset  . Hypertension Mother   . Hearing loss Father    No Known Allergies Prior to Admission medications   Medication Sig Start Date End Date Taking? Authorizing Provider  Ascorbic Acid (VITAMIN C) 100 MG tablet Take 100 mg by mouth daily.    Yes [provider]  cholecalciferol (VITAMIN D) 1000 units tablet Take 1,000 Units by mouth daily.   Yes [provider]  valsartan (DIOVAN) 80 MG tablet TAKE 1 TABLET (80 MG TOTAL) BY MOUTH DAILY. 08/04/16  Yes [provider]   Dg Chest  1 View  Result Date: 09/17/2016 CLINICAL DATA:  Fall with hip fracture EXAM: CHEST 1 VIEW COMPARISON:  None. FINDINGS: No acute pulmonary infiltrate, consolidation or effusion. Heart size upper limits normal. Mild aortic atherosclerosis. No pneumothorax. IMPRESSION: No active disease. Electronically Signed   By: Jasmine PangKim  Fujinaga M.D.   On: 09/17/2016 21:22   Dg Hip Unilat With Pelvis 2-3 Views Left  Result Date: 09/17/2016 CLINICAL DATA:  Fall with pain to the hip EXAM: DG HIP (WITH OR WITHOUT PELVIS) 2-3V LEFT COMPARISON:  None. FINDINGS: Sclerosis of the pubic symphysis. SI joints do not appear widened. Mild arthritis right hip. The pubic rami appear intact. Comminuted intertrochanteric fracture on the left with centrally displaced lesser trochanteric fracture fragment toward the midline. Femoral head is not dislocated. IMPRESSION: Acute, comminuted and slightly distracted intertrochanteric fracture on the left. Electronically Signed   By: Jasmine PangKim  Fujinaga M.D.   On: 09/17/2016 21:22   Dg Femur Min 2 Views Left  Result Date: 09/17/2016 CLINICAL DATA:  Status post fall at home, with left hip pain. Initial encounter. EXAM: LEFT FEMUR 2 VIEWS COMPARISON:  None. FINDINGS: There is a comminuted left femoral intertrochanteric fracture, with a displaced lesser trochanteric fragment, and medial angulation of the distal femur. The left femoral head remains seated at the acetabulum. Mild degenerative change is noted at the pubic symphysis. The distal femur appears intact. No definite knee joint effusion is identified. IMPRESSION: Comminuted left femoral intertrochanteric fracture, with displaced  lesser trochanteric fragment, and medial angulation of the distal femur. Electronically Signed   By: Roanna Raider M.D.   On: 09/17/2016 23:28    Positive ROS: All other systems have been reviewed and were otherwise negative with the exception of those mentioned in the HPI and as above.  Physical Exam: General: Alert, no  acute distress Cardiovascular: No pedal edema Respiratory: No cyanosis, no use of accessory musculature GI: No organomegaly, abdomen is soft and non-tender Skin: No lesions in the area of chief complaint Neurologic: Sensation intact distally Psychiatric: Patient is competent for consent with normal mood and affect Lymphatic: No axillary or cervical lymphadenopathy  MUSCULOSKELETAL: Examination left hip reveals no skin wounds or lesions. She does have shortening and external rotation. She has pain with attempted range of motion of the hip. She is neurovascularly intact.  Assessment: Left intertrochanteric femur fracture  Plan: I discussed the findings with the patient and her family. I recommended intramedullary stabilization of her left intertrochanteric femur fracture. We discussed the risks, benefits, and alternatives. Please see statement of risk. All questions were solicited and answered.  The risks, benefits, and alternatives were discussed with the patient. There are risks associated with the surgery including, but not limited to, problems with anesthesia (death), infection, differences in leg length/angulation/rotation, fracture of bones, loosening or failure of implants, malunion, nonunion, hematoma (blood accumulation) which may require surgical drainage, blood clots, pulmonary embolism, nerve injury (foot drop), and blood vessel injury. The patient understands these risks and elects to proceed.   Tali Coster, Cloyde Reams, MD Cell [phone]    09/18/2016 12:54 PM

## 2016-09-18 NOTE — Progress Notes (Signed)
Initial Nutrition Assessment  DOCUMENTATION CODES:   Not applicable  INTERVENTION:  - Diet advancement as medically feasible. - Will monitor for needs at follow-up.  NUTRITION DIAGNOSIS:   Inadequate oral intake related to inability to eat as evidenced by NPO status.  GOAL:   Patient will meet greater than or equal to 90% of their needs  MONITOR:   Diet advancement, Weight trends, Labs  REASON FOR ASSESSMENT:   Consult Hip fracture protocol  ASSESSMENT:   82 y.o. female with medical history significant of HTN.  Patient was out working in garden today, had fall, landed on L hip.  Immediate L hip pain post fall, worse with movement, unable to bear weight post fall.  Pain is severe.  Patient brought to ED via EMS.  Pt seen for consult. BMI indicates normal weight. Pt has been NPO since admission and is currently in pre-op for L hip fixation. Unable to obtain PTA information or perform nutrition-focused physical assessment at this time. Per chart review, [facility]. Based on current weight, this indicates 10 lb weight loss (6.7% body weight) in the past 4 months which is not significant for time frame.   Medications reviewed; 1 packet Miralax/day. Labs reviewed.   Diet Order:  Diet NPO time specified Diet NPO time specified Except for: Sips with Meds  Skin:  Reviewed, no issues  Last BM:  6/11  Height:   Ht Readings from Last 1 Encounters:  09/17/16 5\' 6"  (1.676 m)    Weight:   Wt Readings from Last 1 Encounters:  09/17/16 140 lb (63.5 kg)    Ideal Body Weight:  59.09 kg  BMI:  Body mass index is 22.6 kg/m.  Estimated Nutritional Needs:   Kcal:  1400-1590 (22-25 kcal/kg)  Protein:  60-70 grams  Fluid:  >/= 1.6 L/day  EDUCATION NEEDS:   No education needs identified at this time    Trenton GammonJessica Lateefah Mallery, MS, RD, LDN, CNSC Inpatient Clinical Dietitian Pager # [phone] After hours/weekend pager # [phone]

## 2016-09-18 NOTE — Discharge Instructions (Signed)
°Dr. Sheron Tallman °Adult Hip & Knee Specialist °[facility] °[address] °, [place] [address] °[phone] ° ° °POSTOPERATIVE DIRECTIONS ° ° ° °Hip Rehabilitation, Guidelines Following Surgery  ° °WEIGHT BEARING °Partial weight bearing with assist device as directed.  touch down weight bearing (30%) ° ° °HOME CARE INSTRUCTIONS  °Remove items at home which could result in a fall. This includes throw rugs or furniture in walking pathways.  °Continue medications as instructed at time of discharge. °· You may have some home medications which will be placed on hold until you complete the course of blood thinner medication. °· 4 days after discharge, you may start showering. No tub baths or soaking your incisions. °Do not put on socks or shoes without following the instructions of your caregivers.   °Sit on chairs with arms. Use the chair arms to help push yourself up when arising.  °Arrange for the use of a toilet seat elevator so you are not sitting low.  °· Walk with walker as instructed.  °You may resume a sexual relationship in one month or when given the OK by your caregiver.  °Use walker as long as suggested by your caregivers.  °Avoid periods of inactivity such as sitting longer than an hour when not asleep. This helps prevent blood clots.  °You may return to work once you are cleared by your surgeon.  °Do not drive a car for 6 weeks or until released by your surgeon.  °Do not drive while taking narcotics.  °Wear elastic stockings for two weeks following surgery during the day but you may remove then at night.  °Make sure you keep all of your appointments after your operation with all of your doctors and caregivers. You should call the office at the above phone number and make an appointment for approximately two weeks after the date of your surgery. °Please pick up a stool softener and laxative for home use as long as you are requiring pain medications. °· ICE to the affected  hip every three hours for 30 minutes at a time and then as needed for pain and swelling. Continue to use ice on the hip for pain and swelling from surgery. You may notice swelling that will progress down to the foot and ankle.  This is normal after surgery.  Elevate the leg when you are not up walking on it.   °It is important for you to complete the blood thinner medication as prescribed by your doctor. °· Continue to use the breathing machine which will help keep your temperature down.  It is common for your temperature to cycle up and down following surgery, especially at night when you are not up moving around and exerting yourself.  The breathing machine keeps your lungs expanded and your temperature down. ° °RANGE OF MOTION AND STRENGTHENING EXERCISES  °These exercises are designed to help you keep full movement of your hip joint. Follow your caregiver's or physical therapist's instructions. Perform all exercises about fifteen times, three times per day or as directed. Exercise both hips, even if you have had only one joint replacement. These exercises can be done on a training (exercise) mat, on the floor, on a table or on a bed. Use whatever works the best and is most comfortable for you. Use music or television while you are exercising so that the exercises are a pleasant break in your day. This will make your life better with the exercises acting as a break in routine you   can look forward to.  °Lying on your back, slowly slide your foot toward your buttocks, raising your knee up off the floor. Then slowly slide your foot back down until your leg is straight again.  °Lying on your back spread your legs as far apart as you can without causing discomfort.  °Lying on your side, raise your upper leg and foot straight up from the floor as far as is comfortable. Slowly lower the leg and repeat.  °Lying on your back, tighten up the muscle in the front of your thigh (quadriceps muscles). You can do this by keeping  your leg straight and trying to raise your heel off the floor. This helps strengthen the largest muscle supporting your knee.  °Lying on your back, tighten up the muscles of your buttocks both with the legs straight and with the knee bent at a comfortable angle while keeping your heel on the floor.  ° °SKILLED REHAB INSTRUCTIONS: °If the patient is transferred to a skilled rehab facility following release from the hospital, a list of the current medications will be sent to the facility for the patient to continue.  When discharged from the skilled rehab facility, please have the facility set up the patient's Home Health Physical Therapy prior to being released. Also, the skilled facility will be responsible for providing the patient with their medications at time of release from the facility to include their pain medication and their blood thinner medication. If the patient is still at the rehab facility at time of the two week follow up appointment, the skilled rehab facility will also need to assist the patient in arranging follow up appointment in our office and any transportation needs. ° °MAKE SURE YOU:  °Understand these instructions.  °Will watch your condition.  °Will get help right away if you are not doing well or get worse. ° °Pick up stool softner and laxative for home use following surgery while on pain medications. °Daily dry dressing changes as needed. °In 4 days, you may remove your dressings and begin taking showers - no tub baths or soaking the incisions. °Continue to use ice for pain and swelling after surgery. °Do not use any lotions or creams on the incision until instructed by your surgeon. ° ° °

## 2016-09-18 NOTE — ED Notes (Signed)
Gave report to Mayflower Villageourtney, Charity fundraiserN for assigned room [phone].

## 2016-09-18 NOTE — Clinical Social Work Note (Signed)
Clinical Social Work Assessment  Patient Details  Name: Kelly [name] MRN: [id number] Date of Birth: [birth year]  Date of referral:  09/18/16               Reason for consult:  Discharge Planning                Permission sought to share information with:    Permission granted to share information::     Name::        Agency::     Relationship::     Contact Information:     Housing/Transportation Living arrangements for the past 2 months:  Single Family Home Source of Information:  Patient Patient Interpreter Needed:  None Criminal Activity/Legal Involvement Pertinent to Current Situation/Hospitalization:  No - Comment as needed Significant Relationships:  Friend, Adult Children Lives with:  Self Do you feel safe going back to the place where you live?   (Unsure-St Rehab may be needed.) Need for family participation in patient care:  No (Coment)  Care giving concerns:  Pt lives alone and is concerned regarding dc plans.   Social Worker assessment / plan:  Pt hospitalized from home on 09/17/16 after falling in her garden. Pt has been dx with a closed left hip fx and will have surgery today. CSW met with pt to offer support / reassurance. Pt reports that she lives alone, but has a supportive family and friends. Pt reports that she may need ST rehab at d/c. CSW provided SNF list. CSW will meet again with pt following surgery / PT recommendations.  Employment status:  Retired Nurse, adult PT Recommendations:  Not assessed at this time Information / Referral to community resources:  [place]  Patient/Family's Response to care:  Disposition to be determined.  Patient/Family's Understanding of and Emotional Response to Diagnosis, Current Treatment, and Prognosis:  Pt is aware of her medical status and pending hip surgery. " I can't believe I did this ! I was having such a nice time in the garden. " Support provided.  Emotional  Assessment Appearance:  Appears stated age Attitude/Demeanor/Rapport:  Other (cooperative) Affect (typically observed):  Appropriate, Calm, Pleasant Orientation:  Oriented to Self, Oriented to Place, Oriented to  Time, Oriented to Situation Alcohol / Substance use:  Not Applicable Psych involvement (Current and /or in the community):  No (Comment)  Discharge Needs  Concerns to be addressed:  Discharge Planning Concerns Readmission within the last 30 days:  No Current discharge risk:  None Barriers to Discharge:  No Barriers Identified   Loraine Maple  [phone] 09/18/2016, 11:36 AM

## 2016-09-18 NOTE — Progress Notes (Signed)
Consult acknowledged. Plan for surgery today. Will see patient in preop holding. Consent ordered. Cont NPO.

## 2016-09-18 NOTE — Op Note (Signed)
OPERATIVE REPORT  SURGEON: Samson FredericBrian Skyylar Kopf, MD   ASSISTANT: Skip MayerBlair Roberts, PA-C.  PREOPERATIVE DIAGNOSIS: Left intertrochanteric femur fracture.   POSTOPERATIVE DIAGNOSIS: Left intertrochanteric femur fracture.   PROCEDURE: Intramedullary fixation, Left femur.   IMPLANTS: Biomet Affixus Hip Fracture Nail, 11 by 400 mm, 130 degrees. 10.5 x 100 mm Hip Fracture Nail Lag Screw. 5 x 42 mm distal interlocking screw 1.  ANESTHESIA:  General  ESTIMATED BLOOD LOSS: 100 mL.  ANTIBIOTICS: 2 g Ancef.  DRAINS: None.  COMPLICATIONS: None.   CONDITION: PACU - hemodynamically stable.   BRIEF CLINICAL NOTE: Kelly [name] is a 82 y.o. female who presented with an intertrochanteric femur fracture. The patient was admitted to the hospitalist service and underwent perioperative risk stratification and medical optimization. The risks, benefits, and alternatives to the procedure were explained, and the patient elected to proceed.  PROCEDURE IN DETAIL: Surgical site was marked by myself. The patient was taken to the operating room and anesthesia was induced on the bed. The patient was then [facility] the [facility] and the nonoperative lower extremity was scissored underneath the operative side. The fracture was reduced with traction, internal rotation, and adduction. The hip was prepped and draped in the normal sterile surgical fashion. Timeout was called verifying side and site of surgery. Preop antibiotics were given with 60 minutes of beginning the procedure.  Fluoroscopy was used to define the patient's anatomy. A 4 cm incision was made just proximal to the tip of the greater trochanter. The awl was used to obtain the standard starting point for a trochanteric entry nail under fluoroscopic control. The guidepin was placed. The entry reamer was used to open the proximal femur.  I placed the guidewire to the level of the physeal scar of the knee. I measured the length of the guidewire. Sequential  reaming was performed up to a size 12.5 mm with excellent chatter. Therefore, a size 11 by 400 mm nail was selected and assembled to the jig on the back table. The nail was placed without any difficulty. Through a separate stab incision, the cannula was placed down to the bone in preparation for the cephalomedullary device. A guidepin was placed into the femoral head using AP and lateral fluoroscopy views. The pin was measured, and then reaming was performed to the appropriate depth. The lag screw was inserted to the appropriate depth. The fracture was compressed through the jig. The setscrew was tightened and then loosened one quarter turn. Using perfect circle technique, a distal interlocking screw was placed. The jig was removed. Final AP and lateral fluoroscopy views were obtained to confirm fracture reduction and hardware placement. Tip apex distance was appropriate. There was no chondral penetration.  The wounds were copiously irrigated with saline. The wound was closed in layers with #1 Vicryl for the fascia, 2-0 Monocryl for the deep dermal layer, and 3-0 Monocryl subcuticular stitch. Glue was applied to the skin. Once the glue was fully hardened, sterile dressing was applied. The patient was then awakened from anesthesia and taken to the PACU in stable condition. Sponge needle and instrument counts were correct at the end of the case 2. There were no known complications.  We will readmit the patient to the hospitalist. Weightbearing status will be touchdown weightbearing with a walker. We will begin Lovenox for DVT prophylaxis. The patient will work with physical therapy and undergo disposition planning.  Please note that a surgical assistant was a medical necessity for this procedure to perform it in a safe and  expeditious manner. Assistant was necessary to provide appropriate retraction of vital neurovascular structures, to prevent femoral fracture, and to allow for anatomic placement of the  nail.

## 2016-09-18 NOTE — Anesthesia Postprocedure Evaluation (Signed)
Anesthesia Post Note  Patient: Kelly [name]  Procedure(s) Performed: Procedure(s) (LRB): INTRAMEDULLARY (IM) NAIL INTERTROCHANTRIC (Left)     Patient location during evaluation: PACU Anesthesia Type: General Level of consciousness: awake and alert Pain management: pain level controlled Vital Signs Assessment: post-procedure vital signs reviewed and stable Respiratory status: spontaneous breathing, nonlabored ventilation, respiratory function stable and patient connected to nasal cannula oxygen Cardiovascular status: blood pressure returned to baseline and stable Postop Assessment: no signs of nausea or vomiting Anesthetic complications: no    Last Vitals:  Vitals:   09/18/16 1005 09/18/16 1542  BP: 122/60 (!) 155/86  Pulse: 72 86  Resp: 15 (!) 28  Temp: 37.5 C 36.7 C    Last Pain:  Vitals:   09/18/16 1542  TempSrc:   PainSc: 3     LLE Motor Response: Purposeful movement (09/18/16 1600) LLE Sensation: Full sensation (09/18/16 1600) RLE Motor Response: Purposeful movement (09/18/16 1600) RLE Sensation: Full sensation (09/18/16 1600)      Zayaan Kozak DAVID

## 2016-09-18 NOTE — Transfer of Care (Signed)
Immediate Anesthesia Transfer of Care Note  Patient: Kelly [name]  Procedure(s) Performed: Procedure(s): INTRAMEDULLARY (IM) NAIL INTERTROCHANTRIC (Left)  Patient Location: PACU  Anesthesia Type:General  Level of Consciousness: awake, alert , oriented and patient cooperative  Airway & Oxygen Therapy: Patient Spontanous Breathing and Patient connected to face mask oxygen  Post-op Assessment: Report given to RN, Post -op Vital signs reviewed and stable and Patient moving all extremities X 4  Post vital signs: stable  Last Vitals:  Vitals:   09/18/16 0646 09/18/16 1005  BP: 138/64 122/60  Pulse: 79 72  Resp:  15  Temp: 37.8 C 37.5 C    Last Pain:  Vitals:   09/18/16 1031  TempSrc:   PainSc: 8       Patients Stated Pain Goal: 3 (09/18/16 0130)  Complications: No apparent anesthesia complications

## 2016-09-18 NOTE — Progress Notes (Signed)
[facility] PLAN OF CARE NOTE Patient: Kelly FuseLinda Fenley ZOX:[id number]   PCP: Patient, No Pcp Per DOB: 12/10/1934   DOA: 09/17/2016   DOS: 09/18/2016    Patient was admitted by my colleague Dr. Julian Reilgardner earlier on 09/18/2016. I have reviewed the H&P as well as assessment and plan and agree with the same. Important changes in the plan are listed below.  Plan of care: Active Problems:   Closed intertrochanteric fracture of left hip, initial encounter [facility](HCC) Preoperative medical evaluation No Coronary revascularization/CVA within 5 years. No Recent stress test. Can Climb flight of stair, participates in recreational activity,does household chores. No Prior adverse event with anesthesia. No Alcohol use, drug use.  Based on RCRI patient is a low-moderate risk for adverse Cardiac outcome from surgery. Recommend further no work up  Also based on gupta score Estimated Risk Probability for Perioperative Myocardial Infarction or Cardiac Arrest 0.21 %.  Fall  History unclear, pt was trying to remove weeds from her flower planter, bend over and the next thing she remembers is that she is lying flat on ground and could not stand up. No dizziness, chest pain, shortnes of breath before or after fall. No focal deficit. No recent changes in medication.  EKG CXR unremarkable.  May have osteoporosis.  No further work up at present.     Author: Lynden OxfordPranav Austen Wygant, MD [facility] Pager: [phone] 09/18/2016 10:05 AM   If 7PM-7AM, please contact night-coverage at [email or web address], password [email or web address]

## 2016-09-18 NOTE — Progress Notes (Signed)
Assumed care from [facility] Herrschaft, RN

## 2016-09-19 ENCOUNTER — Encounter (HOSPITAL_COMMUNITY): Payer: Self-pay | Admitting: Orthopedic Surgery

## 2016-09-19 DIAGNOSIS — M25552 Pain in left hip: Secondary | ICD-10-CM

## 2016-09-19 DIAGNOSIS — I10 Essential (primary) hypertension: Secondary | ICD-10-CM

## 2016-09-19 DIAGNOSIS — S72002A Fracture of unspecified part of neck of left femur, initial encounter for closed fracture: Secondary | ICD-10-CM

## 2016-09-19 DIAGNOSIS — D649 Anemia, unspecified: Secondary | ICD-10-CM

## 2016-09-19 LAB — IRON AND TIBC
Iron: 32 ug/dL (ref 28–170)
Saturation Ratios: 12 % (ref 10.4–31.8)
TIBC: 260 ug/dL (ref 250–450)
UIBC: 228 ug/dL

## 2016-09-19 LAB — CBC
HEMATOCRIT: 31.4 % — AB (ref 36.0–46.0)
Hemoglobin: 10.6 g/dL — ABNORMAL LOW (ref 12.0–15.0)
MCH: 31.5 pg (ref 26.0–34.0)
MCHC: 33.8 g/dL (ref 30.0–36.0)
MCV: 93.5 fL (ref 78.0–100.0)
Platelets: 189 10*3/uL (ref 150–400)
RBC: 3.36 MIL/uL — ABNORMAL LOW (ref 3.87–5.11)
RDW: 12.3 % (ref 11.5–15.5)
WBC: 12.1 10*3/uL — ABNORMAL HIGH (ref 4.0–10.5)

## 2016-09-19 LAB — BASIC METABOLIC PANEL
Anion gap: 7 (ref 5–15)
BUN: 11 mg/dL (ref 6–20)
CALCIUM: 8.4 mg/dL — AB (ref 8.9–10.3)
CO2: 25 mmol/L (ref 22–32)
CREATININE: 0.55 mg/dL (ref 0.44–1.00)
Chloride: 108 mmol/L (ref 101–111)
GFR calc Af Amer: >60 mL/min (ref >60)
GFR calc non Af Amer: >60 mL/min (ref >60)
Glucose, Bld: 130 mg/dL — ABNORMAL HIGH (ref 65–99)
Potassium: 4.3 mmol/L (ref 3.5–5.1)
Sodium: 140 mmol/L (ref 135–145)

## 2016-09-19 LAB — FOLATE: Folate: 11.1 ng/mL (ref >5.9)

## 2016-09-19 LAB — RETICULOCYTES
RBC.: 3.26 MIL/uL — AB (ref 3.87–5.11)
RETIC COUNT ABSOLUTE: 52.2 10*3/uL (ref 19.0–186.0)
Retic Ct Pct: 1.6 % (ref 0.4–3.1)

## 2016-09-19 LAB — VITAMIN B12: Vitamin B-12: 293 pg/mL (ref 180–914)

## 2016-09-19 LAB — FERRITIN: FERRITIN: 233 ng/mL (ref 11–307)

## 2016-09-19 NOTE — Evaluation (Signed)
Occupational Therapy Evaluation Patient Details Name: Kelly [name] MRN: [id number] DOB: [birth year] Today's Date: 09/19/2016    History of Present Illness Pt is an 82 year old female admitted after fall at home resulting in Left intertrochanteric femur fracture and s/p L IM nail 09/18/16   Clinical Impression   Pt was admitted for the above. At baseline, she is independent and active. She currently needs up to total A +2 for bed mobility and LB dressing and is limited by pain.  She is currently PWB (30%). Goals in acute are for mod A. Pt will benefit from continued OT in acute setting as well as follow up OT    Follow Up Recommendations  SNF    Equipment Recommendations  3 in 1 bedside commode    Recommendations for Other Services       Precautions / Restrictions Precautions Precautions: Fall Restrictions Weight Bearing Restrictions: Yes LLE Weight Bearing: Partial weight bearing LLE Partial Weight Bearing Percentage or Pounds: 30      Mobility Bed Mobility           Sit to supine: Total assist;+2 for physical assistance   General bed mobility comments: pt used trapeze to assist with repositioning in bed  Transfers   Equipment used: Rolling walker (2 wheeled)   Sit to Stand: Max assist;+2 physical assistance Stand pivot transfers: Min assist;+2 safety/equipment, once standing       General transfer comment: vcs for PWB, UE/LE placement and sequence for SPT    Balance                                           ADL either performed or assessed with clinical judgement   ADL Overall ADL's : Needs assistance/impaired Eating/Feeding: Independent;Sitting   Grooming: Set up;Sitting   Upper Body Bathing: Set up;Sitting   Lower Body Bathing: Maximal assistance;+2 for physical assistance;Sit to/from stand   Upper Body Dressing : Minimal assistance;Sitting   Lower Body Dressing: Total assistance;+2 for physical assistance;Sit to/from stand    Toilet Transfer: Maximal assistance;+2 for physical assistance;Stand-pivot (chair to bed)             General ADL Comments: pt has catheter in place.  Assisted back to bed.  Pain increases with any movement     Vision         Perception     Praxis      Pertinent Vitals/Pain Pain Assessment: Faces Faces Pain Scale: Hurts whole lot Pain Location: L hip with any movement Pain Descriptors / Indicators: Aching;Sore Pain Intervention(s): Limited activity within patient's tolerance;Monitored during session;Premedicated before session;Repositioned;Ice applied     Hand Dominance     Extremity/Trunk Assessment Upper Extremity Assessment Upper Extremity Assessment: Overall WFL for tasks assessed           Communication Communication Communication: No difficulties   Cognition Arousal/Alertness: Awake/alert Behavior During Therapy: WFL for tasks assessed/performed Overall Cognitive Status: Within Functional Limits for tasks assessed                                     General Comments       Exercises     Shoulder Instructions      Home Living Family/patient expects to be discharged to:: Unsure  Prior Functioning/Environment Level of Independence: Independent                 OT Problem List: Decreased strength;Decreased activity tolerance;Decreased knowledge of use of DME or AE;Decreased knowledge of precautions;Pain      OT Treatment/Interventions: Self-care/ADL training;DME and/or AE instruction;Therapeutic activities;Patient/family education    OT Goals(Current goals can be found in the care plan section) Acute Rehab OT Goals Patient Stated Goal: decreased pain OT Goal Formulation: With patient Time For Goal Achievement: 09/26/16 Potential to Achieve Goals: Good ADL Goals Pt Will Perform Lower Body Bathing: with mod assist;with adaptive equipment;sit to/from stand Pt Will  Transfer to Toilet: with mod assist;bedside commode;stand pivot transfer (including sit to stand) Pt Will Perform Toileting - Clothing Manipulation and hygiene: with mod assist;sit to/from stand Additional ADL Goal #1: pt will perform bed mobility with mod A in preparation for toilet transfers  OT Frequency: Min 2X/week   Barriers to D/C:            Co-evaluation              AM-PAC PT "6 Clicks" Daily Activity     Outcome Measure Help from another person eating meals?: None Help from another person taking care of personal grooming?: A Little Help from another person toileting, which includes using toliet, bedpan, or urinal?: A Lot Help from another person bathing (including washing, rinsing, drying)?: A Lot Help from another person to put on and taking off regular upper body clothing?: A Little Help from another person to put on and taking off regular lower body clothing?: A Lot 6 Click Score: 16   End of Session    Activity Tolerance: Patient limited by pain Patient left: in bed;with call bell/phone within reach;with bed alarm set  OT Visit Diagnosis: Pain Pain - Right/Left: Left Pain - part of body: Hip                Time: [id number] OT Time Calculation (min): 23 min Charges:  OT General Charges $OT Visit: 1 Procedure OT Evaluation $OT Eval Low Complexity: 1 Procedure G-Codes:     Marica OtterMaryellen Aerianna Losey, OTR/L [phone] 09/19/2016  Niklaus Mamaril 09/19/2016, 3:54 PM

## 2016-09-19 NOTE — Progress Notes (Signed)
CSW assisting with dc planning. PT eval pending. SNF search initiated incase ST Rehab is needed. Pt has [employer] medicare which requires prior authorization for rehab placement. CSW will meet again with pt following PT recommendations.  Cori RazorJamie Dazha Kempa LCSW [phone]

## 2016-09-19 NOTE — Progress Notes (Signed)
OT Cancellation Note  Patient Details Name: Reina FuseLinda Cocking MRN: [id number] DOB: 08/22/1934   Cancelled Treatment:    Reason Eval/Treat Not Completed: Other (comment).  Pt wants to continue sitting up in chair for about another hour. I will return to complete OT evaluation at that time and assist her back to bed.  Janazia Schreier 09/19/2016, 2:00 PM  Marica OtterMaryellen Luv Mish, OTR/L [phone] 09/19/2016

## 2016-09-19 NOTE — Evaluation (Signed)
Physical Therapy Evaluation Patient Details Name: Kelly [name] MRN: [id number] DOB: [birth year] Today's Date: 09/19/2016   History of Present Illness  Pt is an 82 year old female admitted after fall at home resulting in Left intertrochanteric femur fracture and s/p L IM nail 09/18/16  Clinical Impression  Patient is s/p above surgery resulting in functional limitations due to the deficits listed below (see PT Problem List).  Patient will benefit from skilled PT to increase their independence and safety with mobility to allow discharge to the venue listed below.  Pt requiring max-total assist for mobility at this time and only able to tolerate transfer to recliner due to L hip pain with any movement.  Recommend ST-SNF prior to returning home alone.     Follow Up Recommendations SNF;Supervision/Assistance - 24 hour    Equipment Recommendations  Rolling walker with 5" wheels    Recommendations for Other Services       Precautions / Restrictions Precautions Precautions: Fall Restrictions Weight Bearing Restrictions: Yes LLE Weight Bearing: Partial weight bearing LLE Partial Weight Bearing Percentage or Pounds: 30      Mobility  Bed Mobility Overal bed mobility: Needs Assistance Bed Mobility: Supine to Sit     Supine to sit: HOB elevated;Total assist;+2 for physical assistance     General bed mobility comments: pt attempted to assist with verbal cues provided however pain limiting and pt requiring increased assist  Transfers Overall transfer level: Needs assistance Equipment used: Rolling walker (2 wheeled) Transfers: Sit to/from Stand;Stand Pivot Transfers Sit to Stand: Max assist;From elevated surface;+2 physical assistance Stand pivot transfers: Min assist;+2 safety/equipment       General transfer comment: verbal cues for PWB status and safe technique, assist to rise, stabilize and control descent  Ambulation/Gait                Stairs             Wheelchair Mobility    Modified Rankin (Stroke Patients Only)       Balance Overall balance assessment: History of Falls                                           Pertinent Vitals/Pain Pain Assessment: Faces Faces Pain Scale: Hurts whole lot Pain Location: L hip with any movement Pain Descriptors / Indicators: Aching;Sore Pain Intervention(s): Limited activity within patient's tolerance;Monitored during session;Repositioned;Premedicated before session;Ice applied    Home Living Family/patient expects to be discharged to:: Private residence Living Arrangements: Alone   Type of Home: House         Home Equipment: None      Prior Function Level of Independence: Independent               Hand Dominance        Extremity/Trunk Assessment        Lower Extremity Assessment Lower Extremity Assessment: LLE deficits/detail LLE Deficits / Details: requiring assist for all movement due to pain, not able to move actively LLE: Unable to fully assess due to pain       Communication   Communication: No difficulties  Cognition Arousal/Alertness: Awake/alert Behavior During Therapy: WFL for tasks assessed/performed Overall Cognitive Status: Within Functional Limits for tasks assessed  General Comments      Exercises     Assessment/Plan    PT Assessment Patient needs continued PT services  PT Problem List Decreased strength;Decreased mobility;Decreased balance;Pain;Decreased knowledge of precautions       PT Treatment Interventions Gait training;DME instruction;Therapeutic activities;Therapeutic exercise;Functional mobility training;Patient/family education    PT Goals (Current goals can be found in the Care Plan section)  Acute Rehab PT Goals PT Goal Formulation: With patient/family Time For Goal Achievement: 09/26/16 Potential to Achieve Goals: Good    Frequency Min  3X/week   Barriers to discharge        Co-evaluation               AM-PAC PT "6 Clicks" Daily Activity  Outcome Measure Difficulty turning over in bed (including adjusting bedclothes, sheets and blankets)?: A Lot Difficulty moving from lying on back to sitting on the side of the bed? : Total Difficulty sitting down on and standing up from a chair with arms (e.g., wheelchair, bedside commode, etc,.)?: Total Help needed moving to and from a bed to chair (including a wheelchair)?: Total Help needed walking in hospital room?: Total Help needed climbing 3-5 steps with a railing? : Total 6 Click Score: 7    End of Session Equipment Utilized During Treatment: Gait belt Activity Tolerance: Patient limited by pain Patient left: in chair;with call bell/phone within reach;with family/visitor present Nurse Communication: Mobility status;Patient requests pain meds PT Visit Diagnosis: Pain;Difficulty in walking, not elsewhere classified (R26.2) Pain - Right/Left: Left Pain - part of body: Hip    Time: [id number] PT Time Calculation (min) (ACUTE ONLY): 17 min   Charges:   PT Evaluation $PT Eval Low Complexity: 1 Procedure     PT G Codes:       Zenovia JarredKati Jenah Vanasten, PT, DPT 09/19/2016 Pager: [phone]   Maida SaleLEMYRE,KATHrine E 09/19/2016, 11:33 AM

## 2016-09-19 NOTE — Progress Notes (Signed)
Pt is forgetful and anxious. Emotional support provided to patient. Bed alarm is on. Pt's son is at bedside and is going to stay overnight. Received order from NP Kirby-Graham to leave foley in until tomorrow at 0600. Will continue to monitor.

## 2016-09-19 NOTE — NC FL2 (Signed)
[place] MEDICAID FL2 LEVEL OF CARE SCREENING TOOL     IDENTIFICATION  Patient Name: Kelly [name] Birthdate: 11/17/1934 Sex: female Admission Date (Current Location): 09/17/2016  [facility] [place]   Facility and Address:  [facility],  [address], [place] [id number]      Provider Number: [id number]  Attending Physician Name and Address:  Edsel PetrinMikhail, Maryann, DO  Relative Name and Phone Number:       Current Level of Care: Hospital Recommended Level of Care: Skilled Nursing Facility Prior Approval Number:    Date Approved/Denied:   PASRR Number:    Discharge Plan: SNF    Current Diagnoses: Patient Active Problem List   Diagnosis Date Noted  . Closed intertrochanteric fracture of left hip, initial encounter (HCC) 09/18/2016  . Closed comminuted intertrochanteric fracture of left femur (HCC) 09/18/2016    Orientation RESPIRATION BLADDER Height & Weight     Self, Time, Situation, Place  Normal Continent Weight: 140 lb (63.5 kg) Height:  5\' 6"  (167.6 cm)  BEHAVIORAL SYMPTOMS/MOOD NEUROLOGICAL BOWEL NUTRITION STATUS  Other (Comment) (no behaviors)   Continent Diet  AMBULATORY STATUS COMMUNICATION OF NEEDS Skin   Limited Assist Verbally Surgical wounds                       Personal Care Assistance Level of Assistance  Bathing, Feeding, Dressing Bathing Assistance: Limited assistance Feeding assistance: Independent Dressing Assistance: Limited assistance     Functional Limitations Info  Sight, Hearing, Speech Sight Info: Adequate Hearing Info: Adequate Speech Info: Adequate    SPECIAL CARE FACTORS FREQUENCY  PT (By licensed PT), OT (By licensed OT)     PT Frequency: 5x wk OT Frequency: 5x wk            Contractures Contractures Info: Not present    Additional Factors Info  Code Status Code Status Info: Full Code             Current Medications (09/19/2016):  This is the current hospital active  medication list Current Facility-Administered Medications  Medication Dose Route Frequency Provider Last Rate Last Dose  . 0.9 %  sodium chloride infusion   Intravenous Continuous Samson FredericSwinteck, Brian, MD 75 mL/hr at 09/19/16 0524    . acetaminophen (TYLENOL) tablet 650 mg  650 mg Oral Q6H PRN Swinteck, Arlys JohnBrian, MD       Or  . acetaminophen (TYLENOL) suppository 650 mg  650 mg Rectal Q6H PRN Swinteck, Arlys JohnBrian, MD      . alum & mag hydroxide-simeth (MAALOX/MYLANTA) 200-200-20 MG/5ML suspension 15 mL  15 mL Oral Q4H PRN Rolly SalterPatel, Pranav M, MD      . cholecalciferol (VITAMIN D) tablet 1,000 Units  1,000 Units Oral Daily Samson FredericSwinteck, Brian, MD   1,000 Units at 09/19/16 [phone]  . enoxaparin (LOVENOX) injection 40 mg  40 mg Subcutaneous Q24H Samson FredericSwinteck, Brian, MD   40 mg at 09/19/16 0844  . HYDROcodone-acetaminophen (NORCO/VICODIN) 5-325 MG per tablet 1-2 tablet  1-2 tablet Oral Q6H PRN Samson FredericSwinteck, Brian, MD   2 tablet at 09/19/16 0844  . irbesartan (AVAPRO) tablet 75 mg  75 mg Oral Daily Samson FredericSwinteck, Brian, MD   75 mg at 09/19/16 0843  . menthol-cetylpyridinium (CEPACOL) lozenge 3 mg  1 lozenge Oral PRN Swinteck, Arlys JohnBrian, MD       Or  . phenol (CHLORASEPTIC) mouth spray 1 spray  1 spray Mouth/Throat PRN Swinteck, Arlys JohnBrian, MD      . metoCLOPramide (REGLAN) tablet 5-10  mg  5-10 mg Oral Q8H PRN Swinteck, Arlys John, MD       Or  . metoCLOPramide (REGLAN) injection 5-10 mg  5-10 mg Intravenous Q8H PRN Swinteck, Arlys John, MD      . morphine 2 MG/ML injection 0.5 mg  0.5 mg Intravenous Q2H PRN Swinteck, Arlys John, MD      . ondansetron (ZOFRAN) tablet 4 mg  4 mg Oral Q6H PRN Swinteck, Arlys John, MD       Or  . ondansetron (ZOFRAN) injection 4 mg  4 mg Intravenous Q6H PRN Swinteck, Arlys John, MD      . polyethylene glycol (MIRALAX / GLYCOLAX) packet 17 g  17 g Oral Daily Rolly Salter, MD   17 g at 09/19/16 0844  . vitamin C (ASCORBIC ACID) tablet 250 mg  250 mg Oral Daily Samson Frederic, MD   250 mg at 09/19/16 0848     Discharge  Medications: Please see discharge summary for a list of discharge medications.  Relevant Imaging Results:  Relevant Lab Results:   Additional Information SS# [id number]  Hope Brandenburger, Dickey Gave, LCSW

## 2016-09-19 NOTE — Progress Notes (Signed)
   Subjective:  Patient reports pain as moderate to severe.  C/o L hip pain.  Objective:   VITALS:   Vitals:   09/18/16 2049 09/19/16 0044 09/19/16 0522 09/19/16 0932  BP: 125/65 (!) 107/51 (!) 121/53 102/61  Pulse: 65 63 66 71  Resp: 17 18 17 16   Temp: 98.5 F (36.9 C) 98 F (36.7 C) 98.5 F (36.9 C) 98.7 F (37.1 C)  TempSrc: Oral Oral Oral Oral  SpO2: 97% 97% 90% 93%  Weight:      Height:        NAD ABD soft Sensation intact distally Intact pulses distally Dorsiflexion/Plantar flexion intact Incision: dressing C/D/I Compartment soft   Lab Results  Component Value Date   WBC 12.1 (H) 09/19/2016   HGB 10.6 (L) 09/19/2016   HCT 31.4 (L) 09/19/2016   MCV 93.5 09/19/2016   PLT 189 09/19/2016   BMET    Component Value Date/Time   NA 140 09/19/2016 0447   K 4.3 09/19/2016 0447   CL 108 09/19/2016 0447   CO2 25 09/19/2016 0447   GLUCOSE 130 (H) 09/19/2016 0447   BUN 11 09/19/2016 0447   CREATININE 0.55 09/19/2016 0447   CALCIUM 8.4 (L) 09/19/2016 0447   GFRNONAA >60 09/19/2016 0447   GFRAA >60 09/19/2016 0447     Assessment/Plan: 1 Day Post-Op   Active Problems:   Closed intertrochanteric fracture of left hip, initial encounter (HCC)   Closed comminuted intertrochanteric fracture of left femur (HCC)   TDWB LLE with walker DVT ppx: lovenox, SCDs, TEDs PO pain control PT/OT Dispo: SNF placement   Kelly [name], Kelly ReamsBrian [name] 09/19/2016, 1:07 PM   Kelly FredericBrian Jerah Esty, Kelly [name] Cell [phone]

## 2016-09-19 NOTE — Progress Notes (Signed)
PROGRESS NOTE    Michon Kaczmarek  ZOX:[id number] DOB: [birth year] DOA: 09/17/2016 PCP: Patient, No Pcp Per   Chief Complaint  Patient presents with  . Hip Injury    left     Brief Narrative:   Assessment & Plan   Left intertrochanteric femur fracture -Status post fall -Orthopedic surgery consulted and appreciated, status post left intramedullary fixation -PT and OT consulted. PT recommended SNF -Social work consulted  Essential hypertension -Continue ARB  Normocytic Anemia vs Anemia secondary to blood loss from recent surgery -Hemoglobin currently 10.6 -Unknown baseline -Will obtain anemia panel and continue to monitor CBC  Constipation -continue miralax, PRN reglan  DVT Prophylaxis  Lovenox  Code Status: Full  Family Communication: Daughter at bedside  Disposition Plan: Admitted. Pending SNF placement.   Consultants Orthopedic surgery  Procedures  Left intramedullary fixation of femur  Antibiotics   Anti-infectives    Start     Dose/Rate Route Frequency Ordered Stop   09/18/16 1241  ceFAZolin (ANCEF) 2-4 GM/100ML-% IVPB    Comments:  Curlene Dolphin   : cabinet override      09/18/16 1241 09/18/16 1403   09/18/16 1145  ceFAZolin (ANCEF) IVPB 2g/100 mL premix     2 g 200 mL/hr over 30 Minutes Intravenous On call to O.R. 09/18/16 1136 09/18/16 1413      Subjective:   Reina Fuse seen and examined today. Patient complains of left leg pain and is afraid to move. Denies chest pain, shortness breath, abdominal pain, nausea or vomiting, diarrhea. States she has not had a bowel movement since admission, she normally goes on a daily basis.  Objective:   Vitals:   09/19/16 0044 09/19/16 0522 09/19/16 0932 09/19/16 1318  BP: (!) 107/51 (!) 121/53 102/61 (!) 105/48  Pulse: 63 66 71 71  Resp: 18 17 16 16   Temp: 98 F (36.7 C) 98.5 F (36.9 C) 98.7 F (37.1 C) 98.1 F (36.7 C)  TempSrc: Oral Oral Oral Oral  SpO2: 97% 90% 93% 95%  Weight:      Height:         Intake/Output Summary (Last 24 hours) at 09/19/16 1446 Last data filed at 09/19/16 1318  Gross per 24 hour  Intake          3338.75 ml  Output             1875 ml  Net          1463.75 ml   Filed Weights   09/17/16 2023  Weight: 63.5 kg (140 lb)   Exam  General: Well developed, well nourished, NAD, appears stated age  HEENT: NCAT, mucous membranes moist.   Cardiovascular: S1 S2 auscultated, no rubs, murmurs or gallops. Regular rate and rhythm.  Respiratory: Clear to auscultation bilaterally with equal chest rise  Abdomen: Soft, nontender, nondistended, + bowel sounds  Extremities: warm dry without cyanosis clubbing or edema. Left hip dressing clean.  Neuro: AAOx3, nonfocal  Psych: Normal affect and demeanor with intact judgement and insight   Data Reviewed: I have personally reviewed following labs and imaging studies  CBC:  Recent Labs Lab 09/17/16 2215 09/19/16 0447  WBC 21.5* 12.1*  NEUTROABS 19.1*  --   HGB 14.6 10.6*  HCT 41.7 31.4*  MCV 93.9 93.5  PLT PLATELET CLUMPS NOTED ON SMEAR, UNABLE TO ESTIMATE 189   Basic Metabolic Panel:  Recent Labs Lab 09/17/16 2215 09/19/16 0447  NA 139 140  K 3.8 4.3  CL 108 108  CO2 25  25  GLUCOSE 137* 130*  BUN 12 11  CREATININE 0.70 0.55  CALCIUM 8.9 8.4*   GFR: Estimated Creatinine Clearance: 52.5 mL/min (by C-G formula based on SCr of 0.55 mg/dL). Liver Function Tests:  Recent Labs Lab 09/17/16 2215  AST 30  ALT 24  ALKPHOS 84  BILITOT 0.2*  PROT 7.5  ALBUMIN 4.0   No results for input(s): LIPASE, AMYLASE in the last 168 hours. No results for input(s): AMMONIA in the last 168 hours. Coagulation Profile: No results for input(s): INR, PROTIME in the last 168 hours. Cardiac Enzymes: No results for input(s): CKTOTAL, CKMB, CKMBINDEX, TROPONINI in the last 168 hours. BNP (last 3 results) No results for input(s): PROBNP in the last 8760 hours. HbA1C: No results for input(s): HGBA1C in the  last 72 hours. CBG: No results for input(s): GLUCAP in the last 168 hours. Lipid Profile: No results for input(s): CHOL, HDL, LDLCALC, TRIG, CHOLHDL, LDLDIRECT in the last 72 hours. Thyroid Function Tests: No results for input(s): TSH, T4TOTAL, FREET4, T3FREE, THYROIDAB in the last 72 hours. Anemia Panel: No results for input(s): VITAMINB12, FOLATE, FERRITIN, TIBC, IRON, RETICCTPCT in the last 72 hours. Urine analysis: No results found for: COLORURINE, APPEARANCEUR, LABSPEC, PHURINE, GLUCOSEU, HGBUR, BILIRUBINUR, KETONESUR, PROTEINUR, UROBILINOGEN, NITRITE, LEUKOCYTESUR Sepsis Labs: @LABRCNTIP (procalcitonin:4,lacticidven:4)  ) Recent Results (from the past 240 hour(s))  Surgical PCR screen     Status: None   Collection Time: 09/18/16  1:58 AM  Result Value Ref Range Status   MRSA, PCR NEGATIVE NEGATIVE Final   Staphylococcus aureus NEGATIVE NEGATIVE Final    Comment:        The Xpert SA Assay (FDA approved for NASAL specimens in patients over 64 years of age), is one component of a comprehensive surveillance program.  Test performance has been validated by [facility] for patients greater than or equal to 83 year old. It is not intended to diagnose infection nor to guide or monitor treatment.       Radiology Studies: Dg Chest 1 View  Result Date: 09/17/2016 CLINICAL DATA:  Fall with hip fracture EXAM: CHEST 1 VIEW COMPARISON:  None. FINDINGS: No acute pulmonary infiltrate, consolidation or effusion. Heart size upper limits normal. Mild aortic atherosclerosis. No pneumothorax. IMPRESSION: No active disease. Electronically Signed   By: Jasmine Pang M.D.   On: 09/17/2016 21:22   Pelvis Portable  Result Date: 09/18/2016 CLINICAL DATA:  Status post internal fixation of the left femur. EXAM: PORTABLE PELVIS 1-2 VIEWS COMPARISON:  Intraoperative fluoroscopic images of the left femur from earlier today. Left hip radiographs 09/17/2016. FINDINGS: ORIF of an intertrochanteric left  femur fracture is again noted. An antegrade intramedullary nail is in place, with distal portion not included on this radiograph. A proximal lag screw terminates in the femoral head. Fracture alignment is near anatomic, with mild residual displacement at the level of the lesser trochanter. The femoral heads are approximated with the acetabula on this single projection. Sclerosis is again noted at the pubic symphysis. IMPRESSION: ORIF of intertrochanteric left femur fracture without evidence of acute complication. Electronically Signed   By: Sebastian Ache M.D.   On: 09/18/2016 16:36   Dg C-arm 1-60 Min-no Report  Result Date: 09/18/2016 Fluoroscopy was utilized by the requesting physician.  No radiographic interpretation.   Dg Hip Operative Unilat With Pelvis Left  Result Date: 09/18/2016 CLINICAL DATA:  Left hip fracture, status post ORIF with intramedullary rod and telescoping screw. 1.2 minutes of fluoro time reported. EXAM: OPERATIVE left HIP (WITH  PELVIS IF PERFORMED) 4 VIEWS TECHNIQUE: Fluoroscopic spot image(s) were submitted for interpretation post-operatively. COMPARISON:  Preoperative study of September 17, 2016 FINDINGS: The patient has undergone placement of an intramedullary rod and a telescoping screw for an intertrochanteric fracture of the left hip. Alignment of the fracture fragments is now near anatomic. The hip joint space is well maintained. The femoral shaft appears intact. IMPRESSION: No immediate postprocedure complication following ORIF for a left intertrochanteric fracture. Electronically Signed   By: David  [place]azil[place]Jordan M.D.   On: 09/18/2016 15:14   Dg Hip Unilat With Pelvis 2-3 Views Left  Result Date: 09/17/2016 CLINICAL DATA:  Fall with pain to the hip EXAM: DG HIP (WITH OR WITHOUT PELVIS) 2-3V LEFT COMPARISON:  None. FINDINGS: Sclerosis of the pubic symphysis. SI joints do not appear widened. Mild arthritis right hip. The pubic rami appear intact. Comminuted intertrochanteric fracture on  the left with centrally displaced lesser trochanteric fracture fragment toward the midline. Femoral head is not dislocated. IMPRESSION: Acute, comminuted and slightly distracted intertrochanteric fracture on the left. Electronically Signed   By: Jasmine PangKim  Fujinaga M.D.   On: 09/17/2016 21:22   Dg Femur Min 2 Views Left  Result Date: 09/17/2016 CLINICAL DATA:  Status post fall at home, with left hip pain. Initial encounter. EXAM: LEFT FEMUR 2 VIEWS COMPARISON:  None. FINDINGS: There is a comminuted left femoral intertrochanteric fracture, with a displaced lesser trochanteric fragment, and medial angulation of the distal femur. The left femoral head remains seated at the acetabulum. Mild degenerative change is noted at the pubic symphysis. The distal femur appears intact. No definite knee joint effusion is identified. IMPRESSION: Comminuted left femoral intertrochanteric fracture, with displaced lesser trochanteric fragment, and medial angulation of the distal femur. Electronically Signed   By: Roanna RaiderJeffery  Chang M.D.   On: 09/17/2016 23:28   Dg Femur Port Min 2 Views Left  Result Date: 09/18/2016 CLINICAL DATA:  Left femoral intramedullary nail fixation. Postop check. EXAM: LEFT FEMUR PORTABLE 2 VIEWS COMPARISON:  Preop study from 09/17/2016 FINDINGS: New left femoral nail is noted across the known comminuted left femoral intertrochanteric fracture alignment appears near anatomic though an AP view of the hip is not included. No hardware failure noted or new postoperative fracture identified. IMPRESSION: New left intramedullary nail fixation of the left femur. Alignment appears near anatomic. Electronically Signed   By: Tollie Ethavid  Kwon M.D.   On: 09/18/2016 16:37     Scheduled Meds: . cholecalciferol  1,000 Units Oral Daily  . enoxaparin (LOVENOX) injection  40 mg Subcutaneous Q24H  . irbesartan  75 mg Oral Daily  . polyethylene glycol  17 g Oral Daily  . vitamin C  250 mg Oral Daily   Continuous Infusions: .  sodium chloride 75 mL/hr at 09/19/16 0524     LOS: 1 day   Time Spent in minutes   30 minutes  Angeles Paolucci D.O. on 09/19/2016 at 2:46 PM  Between 7am to 7pm - Pager - [phone]  After 7pm go to [email or web address] - password [email or web address]  And look for the night coverage person covering for me after hours  [facility] Office  [phone]

## 2016-09-20 DIAGNOSIS — K5900 Constipation, unspecified: Secondary | ICD-10-CM

## 2016-09-20 DIAGNOSIS — S72142A Displaced intertrochanteric fracture of left femur, initial encounter for closed fracture: Principal | ICD-10-CM

## 2016-09-20 LAB — BASIC METABOLIC PANEL
Anion gap: 9 (ref 5–15)
BUN: 15 mg/dL (ref 6–20)
CALCIUM: 8.5 mg/dL — AB (ref 8.9–10.3)
CHLORIDE: 107 mmol/L (ref 101–111)
CO2: 26 mmol/L (ref 22–32)
CREATININE: 0.67 mg/dL (ref 0.44–1.00)
Glucose, Bld: 86 mg/dL (ref 65–99)
Potassium: 4.1 mmol/L (ref 3.5–5.1)
SODIUM: 142 mmol/L (ref 135–145)

## 2016-09-20 LAB — CBC
HCT: 30.4 % — ABNORMAL LOW (ref 36.0–46.0)
HEMOGLOBIN: 10.5 g/dL — AB (ref 12.0–15.0)
MCH: 33 pg (ref 26.0–34.0)
MCHC: 34.5 g/dL (ref 30.0–36.0)
MCV: 95.6 fL (ref 78.0–100.0)
PLATELETS: 205 10*3/uL (ref 150–400)
RBC: 3.18 MIL/uL — ABNORMAL LOW (ref 3.87–5.11)
RDW: 12.8 % (ref 11.5–15.5)
WBC: 13.6 10*3/uL — ABNORMAL HIGH (ref 4.0–10.5)

## 2016-09-20 MED ORDER — HYDROCODONE-ACETAMINOPHEN 5-325 MG PO TABS
1.0000 | ORAL_TABLET | ORAL | Status: DC | PRN
  Administered 2016-09-20 (×2): 2 via ORAL
  Filled 2016-09-20 (×2): qty 2

## 2016-09-20 MED ORDER — BISACODYL 10 MG RE SUPP
10.0000 mg | Freq: Once | RECTAL | Status: AC
  Administered 2016-09-20: 12:00:00 10 mg via RECTAL
  Filled 2016-09-20: qty 1

## 2016-09-20 MED ORDER — HYDROCODONE-ACETAMINOPHEN 5-325 MG PO TABS: 1.0000 | ORAL_TABLET | ORAL | 0 refills | Status: DC | PRN

## 2016-09-20 MED ORDER — ENOXAPARIN SODIUM 40 MG/0.4ML ~~LOC~~ SOLN: 40.0000 mg | SUBCUTANEOUS | 0 refills | Status: DC

## 2016-09-20 MED ORDER — ACETAMINOPHEN 325 MG PO TABS: 650.0000 mg | ORAL_TABLET | Freq: Four times a day (QID) | ORAL | Status: DC | PRN

## 2016-09-20 MED ORDER — POLYETHYLENE GLYCOL 3350 17 G PO PACK: 17.0000 g | PACK | Freq: Every day | ORAL | 0 refills | Status: DC

## 2016-09-20 MED ORDER — METHOCARBAMOL 500 MG PO TABS
500.0000 mg | ORAL_TABLET | Freq: Four times a day (QID) | ORAL | Status: DC | PRN
  Administered 2016-09-20: 13:00:00 500 mg via ORAL
  Filled 2016-09-20: qty 1

## 2016-09-20 NOTE — Progress Notes (Signed)
Physical Therapy Treatment Patient Details Name: Kelly [name] MRN: [id number] DOB: 01-03-1935 Today's Date: 09/20/2016    History of Present Illness Pt is an 82 year old female admitted after fall at home resulting in Left intertrochanteric femur fracture and s/p L IM nail 09/18/16    PT Comments    POD # 2 Assisted OOB very slowly to amb a limited distance to bathroom and back.  Used B platform EVA walker for increased support, to ensure WBing and to decrease pt anxiety/fear of falling.    Follow Up Recommendations  SNF;Supervision/Assistance - 24 hour     Equipment Recommendations  Rolling walker with 5" wheels    Recommendations for Other Services       Precautions / Restrictions Precautions Precautions: Fall Precaution Comments: IM nail Restrictions Weight Bearing Restrictions: Yes LLE Weight Bearing: Partial weight bearing LLE Partial Weight Bearing Percentage or Pounds: 30    Mobility  Bed Mobility Overal bed mobility: Needs Assistance Bed Mobility: Supine to Sit     Supine to sit: HOB elevated;Total assist;+2 for physical assistance Sit to supine: Max assist;+2 for physical assistance;+2 for safety/equipment   General bed mobility comments: increased time and support L LE plus use of bed pad to complete scooting to EOB.  Required extended sitting break to control pain/anxiety  Transfers Overall transfer level: Needs assistance Equipment used: Bilateral platform walker (EVA walker) Transfers: Sit to/from [employer] Transfers Sit to Stand: Max assist;+2 physical assistance Stand pivot transfers: Min assist;+2 safety/equipment       General transfer comment: 75% VC's on proper safet tech, WBing nad safety with turns.  Pt required increased time to reduce anxiety/fear.  Also assisted on/off raised toilet.   Ambulation/Gait Ambulation/Gait assistance: Mod assist;+2 physical assistance;+2 safety/equipment Ambulation Distance (Feet): 10 Feet (5 feet to  bathroo then another 5 feet out of bathroom) Assistive device: Bilateral platform walker (EVA walker for increased support, ensure limited WBing and ecrease pt's anxiety/fear/pain level) Gait Pattern/deviations: Step-to pattern;Decreased stance time - left;Decreased step length - right;Decreased step length - left;Narrow base of support Gait velocity: decreased   General Gait Details: used EVA walker for increased support, ensure limited WBing and ecrease pt's anxiety/fear/pain level   Information systems manager Rank[employer] (Stroke Patients Only)       Balance                                            Cognition Arousal/Alertness: Awake/alert Behavior During Therapy: WFL for tasks assessed/performed Overall Cognitive Status: Within Functional Limits for tasks assessed                                        Exercises      General Comments        Pertinent Vitals/Pain Pain Assessment: Faces Faces Pain Scale: Hurts whole lot Pain Location: L hip with any movement Pain Descriptors / Indicators: Aching;Sore;Operative site guarding;Tender Pain Intervention(s): Monitored during session;Repositioned    Home Living                      Prior Function            PT Goals (current goals can now be  found in the care plan section)      Frequency    Min 3X/week      PT Plan Current plan remains appropriate    Co-evaluation              AM-PAC PT "6 Clicks" Daily Activity  Outcome Measure  Difficulty turning over in bed (including adjusting bedclothes, sheets and blankets)?: Total Difficulty moving from lying on back to sitting on the side of the bed? : Total Difficulty sitting down on and standing up from a chair with arms (e.g., wheelchair, bedside commode, etc,.)?: Total Help needed moving to and from a bed to chair (including a wheelchair)?: A Lot Help needed walking in hospital room?: A  Lot Help needed climbing 3-5 steps with a railing? : Total 6 Click Score: 8    End of Session Equipment Utilized During Treatment: Gait belt Activity Tolerance: Patient limited by pain;Other (comment) (anxiety/fear) Patient left: in chair;with call bell/phone within reach;with family/visitor present Nurse Communication: Mobility status;Patient requests pain meds PT Visit Diagnosis: Pain;Difficulty in walking, not elsewhere classified (R26.2) Pain - Right/Left: Left Pain - part of body: Hip     Time: 1405-1430 PT Time Calculation (min) (ACUTE ONLY): 25 min  Charges:  $Gait Training: 8-22 mins $Therapeutic Activity: 8-22 mins                    G Codes:      Felecia ShellingLori Kayzlee Wirtanen  PTA [facility]  Acute  Rehab Pager      [phone]

## 2016-09-20 NOTE — Clinical Social Work Placement (Signed)
   CLINICAL SOCIAL WORK PLACEMENT  NOTE  Date:  09/20/2016  Patient Details  Name: Kelly [name] MRN: [id number] Date of Birth: 10/05/1934  Clinical Social Work is seeking post-discharge placement for this patient at the Skilled  Nursing Facility level of care (*CSW will initial, date and re-position this form in  chart as items are completed):  Yes   Patient/family provided with [name] Clinical Social Work Department's list of facilities offering this level of care within the geographic area requested by the patient (or if unable, by the patient's family).  Yes   Patient/family informed of their freedom to choose among providers that offer the needed level of care, that participate in Medicare, Medicaid or managed care program needed by the patient, have an available bed and are willing to accept the patient.  Yes   Patient/family informed of [place]'s ownership interest in [facility] and [facility], as well as of the fact that they are under no obligation to receive care at these facilities.  PASRR submitted to EDS on 09/19/16     PASRR number received on 09/19/16     Existing PASRR number confirmed on       FL2 transmitted to all facilities in geographic area requested by pt/family on 09/20/16     FL2 transmitted to all facilities within larger geographic area on       Patient informed that his/her managed care company has contracts with or will negotiate with certain facilities, including the following:        Yes   Patient/family informed of bed offers received.  [facility]     Physician recommends and patient chooses bed at      [name] 09/20/16.  Patient to be transferred to facility by [facility]     Patient family notified on 09/20/16 of transfer.  Name of family member notified:  Daughter     PHYSICIAN       Additional Comment: Pt / family are in agreement  with dc to [name] today. [facility] transport is required. Medical necessity form completed. Pt / family is aware out of pocket costs may be associated with [facility] transport. Pt / family are paying privately for placement at [name]. SNF is out of network with [employer]medicare. D/C Summary sent to SNF for review. Scripts included in d/c packet. # for report provided to nsg.   _______________________________________________ Royetta AsalHaidinger, Anaka Beazer Lee, LCSW  [phone] 09/20/2016, 3:42 PM

## 2016-09-20 NOTE — Progress Notes (Signed)
NUTRITION NOTE  Pt seen for full assessment by this RD on 6/12. Discharge order and summary in for pt to d/c to SNF today. Pt has been eating well since surgery: 100% breakfast and lunch and 75% dinner yesterday and 50% of breakfast this AM. Will monitor for needs if pt unable to d/c today.    Trenton GammonJessica Cova Knieriem, MS, RD, LDN, [facility] Inpatient Clinical Dietitian Pager # [phone] After hours/weekend pager # [phone]

## 2016-09-20 NOTE — Discharge Summary (Signed)
Physician Discharge Summary  Kelly [name] ZOX:[id number] DOB: 02/10/1935 DOA: 09/17/2016  PCP: Patient, No Pcp Per  Admit date: 09/17/2016 Discharge date: 09/20/2016  Time spent: 45 minutes  Recommendations for Outpatient Follow-up:  Patient will be discharged to skilled nursing facility. Continue physical and occupational therapy.  Patient will need to follow up with primary care provider within one week of discharge, repeat CBC.  Follow up with Dr. Linna CapriceSwinteck, orthopedic surgery, in 2 weeks.  Patient should continue medications as prescribed.  Patient should follow a heart healthy diet.   Discharge Diagnoses:  Left intertrochanteric femur fracture Essential hypertension Normocytic Anemia vs Anemia secondary to blood loss from recent surgery Constipation  Discharge Condition: Stable  Diet recommendation: heart healthy  Filed Weights   09/17/16 2023  Weight: 63.5 kg (140 lb)    History of present illness:  On 09/18/2016 by Dr. Lyda PeroneJared Gardner Kelly FuseLinda [name] is a 82 y.o. female with medical history significant of HTN.  Patient was out working in garden today, had fall, landed on L hip.  Immediate L hip pain post fall, worse with movement, unable to bear weight post fall.  Pain is severe.  Patient brought to ED via EMS.  Hospital Course:  Left intertrochanteric femur fracture -Status post fall -Orthopedic surgery consulted and appreciated, status post left intramedullary fixation -PT and OT consulted. PT/OT recommended SNF, 3in1 bedside commode, rolling walker -Social work consulted -Discussed with Dr. Linna CapriceSwinteck, ok for patient to be discharged to SNF today with 30days of lovenox 40mg , norco for pain control. He will follow up with the patient in 2 weeks.   Essential hypertension -Continue ARB  Normocytic Anemia vs Anemia secondary to blood loss from recent surgery -Hemoglobin currently 10.5 and stable -Unknown baseline, was 14.6 on admission -Anemia panel reviewed and  WNL  Constipation -continue miralax  Consultants Orthopedic surgery  Procedures  Left intramedullary fixation of femur  Discharge Exam: Vitals:   09/20/16 0438 09/20/16 0938  BP: (!) 150/70 (!) 105/54  Pulse: 82 72  Resp: 15   Temp: 98.6 F (37 C)    Patient continues to complain of left leg/hip pain, with any movement. Afraid to move her leg. Denies chest pain, shortness of breath, abdominal pain, nausea, vomiting, dizziness, headache.   General: Well developed, well nourished, NAD, appears stated age  HEENT: NCAT, mucous membranes moist.  Cardiovascular: S1 S2 auscultated, no rubs, murmurs or gallops. Regular rate and rhythm.  Respiratory: Clear to auscultation bilaterally with equal chest rise  Abdomen: Soft, nontender, nondistended, + bowel sounds  Extremities: warm dry without cyanosis clubbing or edema. Dressing on left hip-clean and intact.  Neuro: AAOx3, nonfocal  Psych: Anxious, however appropriate with intact judgement and insight, pleasant  Discharge Instructions Discharge Instructions    Diet - low sodium heart healthy    Complete by:  As directed    Discharge instructions    Complete by:  As directed    Patient will be discharged to skilled nursing facility. Continue physical and occupational therapy.  Patient will need to follow up with primary care provider within one week of discharge, repeat CBC.  Follow up with Dr. Linna CapriceSwinteck, orthopedic surgery, in 2 weeks.  Patient should continue medications as prescribed.  Patient should follow a heart healthy diet.   Increase activity slowly    Complete by:  As directed      Current Discharge Medication List    START taking these medications   Details  acetaminophen (TYLENOL) 325 MG tablet Take 2 tablets (  650 mg total) by mouth every 6 (six) hours as needed for mild pain (or Fever >/= 101).    enoxaparin (LOVENOX) 40 MG/0.4ML injection Inject 0.4 mLs (40 mg total) into the skin daily. Qty: 30 Syringe,  Refills: 0    HYDROcodone-acetaminophen (NORCO/VICODIN) 5-325 MG tablet Take 1-2 tablets by mouth every 4 (four) hours as needed for moderate pain. Qty: 15 tablet, Refills: 0    polyethylene glycol (MIRALAX / GLYCOLAX) packet Take 17 g by mouth daily. Qty: 14 each, Refills: 0      CONTINUE these medications which have NOT CHANGED   Details  Ascorbic Acid (VITAMIN C) 100 MG tablet Take 100 mg by mouth daily.     cholecalciferol (VITAMIN D) 1000 units tablet Take 1,000 Units by mouth daily.    valsartan (DIOVAN) 80 MG tablet TAKE 1 TABLET (80 MG TOTAL) BY MOUTH DAILY. Refills: 3       No Known Allergies Follow-up Information    Swinteck, Arlys John, MD. Schedule an appointment as soon as possible for a visit in 2 week(s).   Specialty:  Orthopedic Surgery Why:  For wound re-check Contact information: [address] [place] [address] [phone]            The results of significant diagnostics from this hospitalization (including imaging, microbiology, ancillary and laboratory) are listed below for reference.    Significant Diagnostic Studies: Dg Chest 1 View  Result Date: 09/17/2016 CLINICAL DATA:  Fall with hip fracture EXAM: CHEST 1 VIEW COMPARISON:  None. FINDINGS: No acute pulmonary infiltrate, consolidation or effusion. Heart size upper limits normal. Mild aortic atherosclerosis. No pneumothorax. IMPRESSION: No active disease. Electronically Signed   By: Jasmine Pang M.D.   On: 09/17/2016 21:22   Pelvis Portable  Result Date: 09/18/2016 CLINICAL DATA:  Status post internal fixation of the left femur. EXAM: PORTABLE PELVIS 1-2 VIEWS COMPARISON:  Intraoperative fluoroscopic images of the left femur from earlier today. Left hip radiographs 09/17/2016. FINDINGS: ORIF of an intertrochanteric left femur fracture is again noted. An antegrade intramedullary nail is in place, with distal portion not included on this radiograph. A proximal lag screw terminates in  the femoral head. Fracture alignment is near anatomic, with mild residual displacement at the level of the lesser trochanter. The femoral heads are approximated with the acetabula on this single projection. Sclerosis is again noted at the pubic symphysis. IMPRESSION: ORIF of intertrochanteric left femur fracture without evidence of acute complication. Electronically Signed   By: Sebastian Ache M.D.   On: 09/18/2016 16:36   Dg C-arm 1-60 Min-no Report  Result Date: 09/18/2016 Fluoroscopy was utilized by the requesting physician.  No radiographic interpretation.   Dg Hip Operative Unilat With Pelvis Left  Result Date: 09/18/2016 CLINICAL DATA:  Left hip fracture, status post ORIF with intramedullary rod and telescoping screw. 1.2 minutes of fluoro time reported. EXAM: OPERATIVE left HIP (WITH PELVIS IF PERFORMED) 4 VIEWS TECHNIQUE: Fluoroscopic spot image(s) were submitted for interpretation post-operatively. COMPARISON:  Preoperative study of September 17, 2016 FINDINGS: The patient has undergone placement of an intramedullary rod and a telescoping screw for an intertrochanteric fracture of the left hip. Alignment of the fracture fragments is now near anatomic. The hip joint space is well maintained. The femoral shaft appears intact. IMPRESSION: No immediate postprocedure complication following ORIF for a left intertrochanteric fracture. Electronically Signed   By: David  [place] M.D.   On: 09/18/2016 15:14   Dg Hip Unilat With Pelvis 2-3 Views Left  Result Date: 09/17/2016 CLINICAL DATA:  Fall with pain to the hip EXAM: DG HIP (WITH OR WITHOUT PELVIS) 2-3V LEFT COMPARISON:  None. FINDINGS: Sclerosis of the pubic symphysis. SI joints do not appear widened. Mild arthritis right hip. The pubic rami appear intact. Comminuted intertrochanteric fracture on the left with centrally displaced lesser trochanteric fracture fragment toward the midline. Femoral head is not dislocated. IMPRESSION: Acute, comminuted and  slightly distracted intertrochanteric fracture on the left. Electronically Signed   By: Jasmine Pang M.D.   On: 09/17/2016 21:22   Dg Femur Min 2 Views Left  Result Date: 09/17/2016 CLINICAL DATA:  Status post fall at home, with left hip pain. Initial encounter. EXAM: LEFT FEMUR 2 VIEWS COMPARISON:  None. FINDINGS: There is a comminuted left femoral intertrochanteric fracture, with a displaced lesser trochanteric fragment, and medial angulation of the distal femur. The left femoral head remains seated at the acetabulum. Mild degenerative change is noted at the pubic symphysis. The distal femur appears intact. No definite knee joint effusion is identified. IMPRESSION: Comminuted left femoral intertrochanteric fracture, with displaced lesser trochanteric fragment, and medial angulation of the distal femur. Electronically Signed   By: Roanna Raider M.D.   On: 09/17/2016 23:28   Dg Femur Port Min 2 Views Left  Result Date: 09/18/2016 CLINICAL DATA:  Left femoral intramedullary nail fixation. Postop check. EXAM: LEFT FEMUR PORTABLE 2 VIEWS COMPARISON:  Preop study from 09/17/2016 FINDINGS: New left femoral nail is noted across the known comminuted left femoral intertrochanteric fracture alignment appears near anatomic though an AP view of the hip is not included. No hardware failure noted or new postoperative fracture identified. IMPRESSION: New left intramedullary nail fixation of the left femur. Alignment appears near anatomic. Electronically Signed   By: Tollie Eth M.D.   On: 09/18/2016 16:37    Microbiology: Recent Results (from the past 240 hour(s))  Surgical PCR screen     Status: None   Collection Time: 09/18/16  1:58 AM  Result Value Ref Range Status   MRSA, PCR NEGATIVE NEGATIVE Final   Staphylococcus aureus NEGATIVE NEGATIVE Final    Comment:        The Xpert SA Assay (FDA approved for NASAL specimens in patients over 39 years of age), is one component of a comprehensive  surveillance program.  Test performance has been validated by [facility] for patients greater than or equal to 63 year old. It is not intended to diagnose infection nor to guide or monitor treatment.      Labs: Basic Metabolic Panel:  Recent Labs Lab 09/17/16 2215 09/19/16 0447 09/20/16 0422  NA 139 140 142  K 3.8 4.3 4.1  CL 108 108 107  CO2 25 25 26   GLUCOSE 137* 130* 86  BUN 12 11 15   CREATININE 0.70 0.55 0.67  CALCIUM 8.9 8.4* 8.5*   Liver Function Tests:  Recent Labs Lab 09/17/16 2215  AST 30  ALT 24  ALKPHOS 84  BILITOT 0.2*  PROT 7.5  ALBUMIN 4.0   No results for input(s): LIPASE, AMYLASE in the last 168 hours. No results for input(s): AMMONIA in the last 168 hours. CBC:  Recent Labs Lab 09/17/16 2215 09/19/16 0447 09/20/16 0422  WBC 21.5* 12.1* 13.6*  NEUTROABS 19.1*  --   --   HGB 14.6 10.6* 10.5*  HCT 41.7 31.4* 30.4*  MCV 93.9 93.5 95.6  PLT PLATELET CLUMPS NOTED ON SMEAR, UNABLE TO ESTIMATE 189 205   Cardiac Enzymes: No results for input(s): CKTOTAL,  CKMB, CKMBINDEX, TROPONINI in the last 168 hours. BNP: BNP (last 3 results) No results for input(s): BNP in the last 8760 hours.  ProBNP (last 3 results) No results for input(s): PROBNP in the last 8760 hours.  CBG: No results for input(s): GLUCAP in the last 168 hours.     SignedEdsel Petrin  [facility] 09/20/2016, 10:44 AM

## 2016-09-20 NOTE — Progress Notes (Signed)
SNF called and messages left 3 times, unable to give report.

## 2016-12-05 ENCOUNTER — Other Ambulatory Visit: Payer: Self-pay | Admitting: Family Medicine

## 2016-12-05 DIAGNOSIS — M8580 Other specified disorders of bone density and structure, unspecified site: Secondary | ICD-10-CM

## 2016-12-05 DIAGNOSIS — Z1231 Encounter for screening mammogram for malignant neoplasm of breast: Secondary | ICD-10-CM

## 2017-01-01 ENCOUNTER — Ambulatory Visit
Admission: RE | Admit: 2017-01-01 | Discharge: 2017-01-01 | Disposition: A | Discharge status: Home / Self Care | Payer: Medicare HMO | Source: Ambulatory Visit | Attending: Family Medicine | Admitting: Family Medicine

## 2017-01-01 DIAGNOSIS — Z1231 Encounter for screening mammogram for malignant neoplasm of breast: Secondary | ICD-10-CM

## 2017-01-01 DIAGNOSIS — M8580 Other specified disorders of bone density and structure, unspecified site: Secondary | ICD-10-CM

## 2017-03-21 ENCOUNTER — Ambulatory Visit: Payer: Medicare HMO | Admitting: Adult Health

## 2017-03-21 ENCOUNTER — Other Ambulatory Visit: Payer: Medicare HMO

## 2017-08-26 ENCOUNTER — Telehealth: Payer: Self-pay | Admitting: Diagnostic Neuroimaging

## 2017-08-26 ENCOUNTER — Encounter

## 2017-08-26 ENCOUNTER — Ambulatory Visit: Payer: Medicare HMO | Admitting: Diagnostic Neuroimaging

## 2017-08-26 ENCOUNTER — Encounter: Payer: Self-pay | Admitting: Diagnostic Neuroimaging

## 2017-08-26 DIAGNOSIS — R413 Other amnesia: Secondary | ICD-10-CM

## 2017-08-26 NOTE — Patient Instructions (Signed)
-   check MRI brain  - increase safety / supervision; caution with driving and finances  - consider memantine / donepezil  - consider research studies  - caregiver resources reviewed  - brain health activities reviewed

## 2017-08-26 NOTE — Telephone Encounter (Signed)
[employer] Medicare order sent to GI. They obtain the auth and will reach out to the pt to schedule.  °

## 2017-08-26 NOTE — Progress Notes (Signed)
[facility]  PATIENT: Roman Sandall DOB: July 15, 1934  REFERRING CLINICIAN: Terri Piedra, PA HISTORY FROM: patient and daughter  REASON FOR VISIT: new consult    HISTORICAL  CHIEF COMPLAINT:  Chief Complaint  Patient presents with  . NP Mady Gemma, PA    Rm 7 [name], daughter with pt.  . Memory Loss    MMSE 23/30    HISTORY OF PRESENT ILLNESS:   83 year old female here for evaluation of memory loss.  According to patient, she was referred here because she fell down in June 2018 and had a left hip fracture.  However according to patient's daughter and review of referring notes patient was referred here for memory loss of elevation.  For last 2 years patient had gradual onset progressive short-term memory loss, confusion, difficulty with navigation, difficulty with repeating herself, not being able to pare bills properly, having confusion when meeting with her accountant.  Patient also has been more agitated and aggravated lately.  She is more socially withdrawn.  Symptoms did worsen when patient underwent left hip fracture surgery in June 2018.  Since that time memory loss symptoms seem to have rapidly worsened.  There is family history of dementia and patient's mother and other family members.  Patient currently living alone.  Patient drives short distances.   REVIEW OF SYSTEMS: Full 14 system review of systems performed and negative with exception of: Decreased energy change in appetite memory loss confusion weakness increased thirst swelling in legs fatigue itching.  ALLERGIES: No Known Allergies  HOME MEDICATIONS: Outpatient Medications Prior to Visit  Medication Sig Dispense Refill  . Ascorbic Acid (VITAMIN C) 100 MG tablet Take 100 mg by mouth daily.     . Cholecalciferol (VITAMIN D-1000 MAX ST) 1000 units tablet Take by mouth daily.     Marland Kitchen losartan (COZAAR) 25 MG tablet Take 25 mg by mouth daily.  11  . Multiple Minerals-Vitamins  (CALCIUM-MAGNESIUM-ZINC-D3 PO) Take by mouth daily.    Marland Kitchen acetaminophen (TYLENOL) 325 MG tablet Take 2 tablets (650 mg total) by mouth every 6 (six) hours as needed for mild pain (or Fever >/= 101).    . cholecalciferol (VITAMIN D) 1000 units tablet Take 1,000 Units by mouth daily.    Marland Kitchen enoxaparin (LOVENOX) 40 MG/0.4ML injection Inject 0.4 mLs (40 mg total) into the skin daily. 30 Syringe 0  . HYDROcodone-acetaminophen (NORCO/VICODIN) 5-325 MG tablet Take 1-2 tablets by mouth every 4 (four) hours as needed for moderate pain. 15 tablet 0  . polyethylene glycol (MIRALAX / GLYCOLAX) packet Take 17 g by mouth daily. 14 each 0  . valsartan (DIOVAN) 80 MG tablet TAKE 1 TABLET (80 MG TOTAL) BY MOUTH DAILY.  3   No facility-administered medications prior to visit.     PAST MEDICAL HISTORY: Past Medical History:  Diagnosis Date  . Hypertension     PAST SURGICAL HISTORY: Past Surgical History:  Procedure Laterality Date  . INTRAMEDULLARY (IM) NAIL INTERTROCHANTERIC Left 09/18/2016   Procedure: INTRAMEDULLARY (IM) NAIL INTERTROCHANTRIC;  Surgeon: Samson Frederic, MD;  Location: [facility];  Service: Orthopedics;  Laterality: Left;    FAMILY HISTORY: Family History  Problem Relation Age of Onset  . Hypertension Mother   . Heart failure Mother   . Hearing loss Father   . Aneurysm Father        brain  . Breast cancer Neg Hx     SOCIAL HISTORY:  Social History   Socioeconomic History  . Marital status: Divorced  Spouse name: Not on file  . Number of children: Not on file  . Years of education: Not on file  . Highest education level: Not on file  Occupational History  . Not on file  Social Needs  . Financial resource strain: Not on file  . Food insecurity:    Worry: Not on file    Inability: Not on file  . Transportation needs:    Medical: Not on file    Non-medical: Not on file  Tobacco Use  . Smoking status: Passive Smoke Exposure - Never Smoker  . Smokeless tobacco: Never Used   Substance and Sexual Activity  . Alcohol use: No  . Drug use: Never  . Sexual activity: Not on file  Lifestyle  . Physical activity:    Days per week: Not on file    Minutes per session: Not on file  . Stress: Not on file  Relationships  . Social connections:    Talks on phone: Not on file    Gets together: Not on file    Attends religious service: Not on file    Active member of club or organization: Not on file    Attends meetings of clubs or organizations: Not on file    Relationship status: Not on file  . Intimate partner violence:    Fear of current or ex partner: Not on file    Emotionally abused: Not on file    Physically abused: Not on file    Forced sexual activity: Not on file  Other Topics Concern  . Not on file  Social History Narrative   Lives home alone.  Diivorced.  Education [employer].  Children 3, [name] daughter with her today. 08-26-17.  Caffeine 2 cups daily.     PHYSICAL EXAM  GENERAL EXAM/CONSTITUTIONAL: Vitals:  Vitals:   08/26/17 1007  BP: 123/75  Pulse: (!) 59  Weight: 156 lb 3.2 oz (70.9 kg)  Height:  (1.676 m)     Body mass index is 25.21 kg/m.  Visual Acuity Screening   Right eye Left eye Both eyes  Without correction:     With correction: 20/50 20/100      Patient is in no distress; well developed, nourished and groomed; neck is supple  CARDIOVASCULAR:  Examination of carotid arteries is normal; no carotid bruits  Regular rate and rhythm, no murmurs  Examination of peripheral vascular system by observation and palpation is normal  EYES:  Ophthalmoscopic exam of optic discs and posterior segments is normal; no papilledema or hemorrhages  MUSCULOSKELETAL:  Gait, strength, tone, movements noted in Neurologic exam below  NEUROLOGIC: MENTAL STATUS:  MMSE - Mini Mental State Exam 08/26/2017  Orientation to time 5  Orientation to Place 3  Registration 3  Attention/ Calculation 2  Recall 2  Language- name 2 objects 2   Language- repeat 1  Language- follow 3 step command 3  Language- read & follow direction 1  Write a sentence 1  Copy design 0  Total score 23    awake, alert, oriented to person, place and time  recent and remote memory intact  normal attention and concentration  language fluent, comprehension intact, naming intact,   fund of knowledge appropriate  CRANIAL NERVE:   2nd - no papilledema on fundoscopic exam  2nd, 3rd, 4th, 6th - pupils equal and reactive to light, visual fields full to confrontation, extraocular muscles intact, no nystagmus  5th - facial sensation symmetric  7th - facial strength symmetric  8th -  hearing intact  9th - palate elevates symmetrically, uvula midline  11th - shoulder shrug symmetric  12th - tongue protrusion midline  MOTOR:   normal bulk and tone, full strength in the BUE, BLE  SENSORY:   normal and symmetric to light touch, temperature, vibration  COORDINATION:   finger-nose-finger, fine finger movements normal  REFLEXES:   deep tendon reflexes present and symmetric  GAIT/STATION:   narrow based gait    DIAGNOSTIC DATA (LABS, IMAGING, TESTING) - I reviewed patient records, labs, notes, testing and imaging myself where available.  Lab Results  Component Value Date   WBC 13.6 (H) 09/20/2016   HGB 10.5 (L) 09/20/2016   HCT 30.4 (L) 09/20/2016   MCV 95.6 09/20/2016   PLT 205 09/20/2016      Component Value Date/Time   NA 142 09/20/2016 0422   K 4.1 09/20/2016 0422   CL 107 09/20/2016 0422   CO2 26 09/20/2016 0422   GLUCOSE 86 09/20/2016 0422   BUN 15 09/20/2016 0422   CREATININE 0.67 09/20/2016 0422   CALCIUM 8.5 (L) 09/20/2016 0422   PROT 7.5 09/17/2016 2215   ALBUMIN 4.0 09/17/2016 2215   AST 30 09/17/2016 2215   ALT 24 09/17/2016 2215   ALKPHOS 84 09/17/2016 2215   BILITOT 0.2 (L) 09/17/2016 2215   GFRNONAA >60 09/20/2016 0422   GFRAA >60 09/20/2016 0422   No results found for: CHOL, HDL, LDLCALC,  LDLDIRECT, TRIG, CHOLHDL No results found for: VHQI6N Lab Results  Component Value Date   VITAMINB12 293 09/19/2016   No results found for: TSH      ASSESSMENT AND PLAN  83 y.o. year old female here with gradual onset progressive short-term memory loss confusion, navigation difficulty, difficulty with ADLs (such as shopping, food preparation, bills and finances), most consistent with neurodegenerative dementia.  MMSE 23 out of 30 Animal fluency testing 14 [place] instrumental ADL 7 out of 10 Katz ADL 6 out of 6   Ddx: mild dementia vs other secondary cause  1. Memory loss     PLAN:  - check MRI brain - increase safety / supervision - consider memantine / donepezil - consider research studies - caregiver resources reviewed - brain health activities reviewed  Orders Placed This Encounter  Procedures  . MR BRAIN WO CONTRAST   Return in about 6 months (around 02/26/2018) for with NP.    Suanne Marker, MD 08/26/2017, 10:23 AM Certified in Neurology, Neurophysiology and Neuroimaging  [facility] [address] [place], [place] [address] [phone]

## 2018-03-05 NOTE — Progress Notes (Signed)
[facility]  PATIENT: Kelly [name] DOB: [birth year]   REASON FOR VISIT: Follow-up for memory loss HISTORY FROM: Patient and daughter [name]   HISTORY OF PRESENT ILLNESS: 5/20/19VP 83 year old female here for evaluation of memory loss.  According to patient, she was referred here because she fell down in June 2018 and had a left hip fracture.  However according to patient's daughter and review of referring notes patient was referred here for memory loss of elevation.  For last 2 years patient had gradual onset progressive short-term memory loss, confusion, difficulty with navigation, difficulty with repeating herself, not being able to pare bills properly, having confusion when meeting with her accountant.  Patient also has been more agitated and aggravated lately.  She is more socially withdrawn.  Symptoms did worsen when patient underwent left hip fracture surgery in June 2018.  Since that time memory loss symptoms seem to have rapidly worsened. There is family history of dementia and patient's mother and other family members. Patient currently living alone. Patient drives short distances. UPDATE 12/2/2019CM [name], 83 year old female returns for follow-up with history of memory loss.  She did not get MRI of the brain after her last visit.  She continues to live alone but has help every day from 8-2 from [facility].  They provide assistance such as she is driven to appointments, meal preparation light housekeeping laundry shopping etc.  No safety issues identified.  Patient feels her memory is the same.  Daughter does notice that her memory fluctuates and she has good and bad days.  No falls.  She ambulates with a single-point cane outside.  She returns for reevaluation REVIEW OF SYSTEMS: Full 14 system review of systems performed and notable only for those listed, all others are neg:  Constitutional: neg  Cardiovascular: neg Ear/Nose/Throat: Hard of  hearing Skin: neg Eyes: neg Respiratory: neg Gastroitestinal: neg  Hematology/Lymphatic: neg  Endocrine: neg Musculoskeletal:neg Allergy/Immunology: neg Neurological: Memory loss Psychiatric: neg Sleep : neg   ALLERGIES: No Known Allergies  HOME MEDICATIONS: Outpatient Medications Prior to Visit  Medication Sig Dispense Refill  . Ascorbic Acid (VITAMIN C) 100 MG tablet Take 100 mg by mouth daily.     . Cholecalciferol (VITAMIN D-1000 MAX ST) 1000 units tablet Take by mouth daily.     Marland Kitchen losartan (COZAAR) 25 MG tablet Take 25 mg by mouth daily.  11  . Multiple Minerals-Vitamins (CALCIUM-MAGNESIUM-ZINC-D3 PO) Take by mouth daily.     No facility-administered medications prior to visit.     PAST MEDICAL HISTORY: Past Medical History:  Diagnosis Date  . Hypertension     PAST SURGICAL HISTORY: Past Surgical History:  Procedure Laterality Date  . INTRAMEDULLARY (IM) NAIL INTERTROCHANTERIC Left 09/18/2016   Procedure: INTRAMEDULLARY (IM) NAIL INTERTROCHANTRIC;  Surgeon: Samson Frederic, MD;  Location: [facility];  Service: Orthopedics;  Laterality: Left;    FAMILY HISTORY: Family History  Problem Relation Age of Onset  . Hypertension Mother   . Heart failure Mother   . Hearing loss Father   . Aneurysm Father        brain  . Breast cancer Neg Hx     SOCIAL HISTORY: Social History   Socioeconomic History  . Marital status: Divorced    Spouse name: Not on file  . Number of children: Not on file  . Years of education: Not on file  . Highest education level: Not on file  Occupational History  . Not on file  Social Needs  .  Financial resource strain: Not on file  . Food insecurity:    Worry: Not on file    Inability: Not on file  . Transportation needs:    Medical: Not on file    Non-medical: Not on file  Tobacco Use  . Smoking status: Passive Smoke Exposure - Never Smoker  . Smokeless tobacco: Never Used  Substance and Sexual Activity  . Alcohol use: No  .  Drug use: Never  . Sexual activity: Not on file  Lifestyle  . Physical activity:    Days per week: Not on file    Minutes per session: Not on file  . Stress: Not on file  Relationships  . Social connections:    Talks on phone: Not on file    Gets together: Not on file    Attends religious service: Not on file    Active member of club or organization: Not on file    Attends meetings of clubs or organizations: Not on file    Relationship status: Not on file  . Intimate partner violence:    Fear of current or ex partner: Not on file    Emotionally abused: Not on file    Physically abused: Not on file    Forced sexual activity: Not on file  Other Topics Concern  . Not on file  Social History Narrative   Lives home alone.  Diivorced.  Education [employer].  Children 3, [name] daughter with her today. 08-26-17.  Caffeine 2 cups daily.     PHYSICAL EXAM  Vitals:   03/10/18 1118  BP: 130/75  Pulse: 63  Weight: 155 lb 12.8 oz (70.7 kg)  Height: 5\' 6"  (1.676 m)   Body mass index is 25.15 kg/m.  Generalized: Well developed, in no acute distress  Head: normocephalic and atraumatic,. Oropharynx benign  Neck: Supple, no carotid bruits  Cardiac: Regular rate rhythm, no murmur  Musculoskeletal: No deformity   Neurological examination   Mentation: Alert AFT 12. Clock drawing 4/4 MMSE - Mini Mental State Exam 03/10/2018 08/26/2017  Orientation to time 5 5  Orientation to Place 3 3  Registration 3 3  Attention/ Calculation 4 2  Recall 1 2  Language- name 2 objects 2 2  Language- repeat 1 1  Language- follow 3 step command 3 3  Language- read & follow direction 1 1  Write a sentence 1 1  Copy design 1 0  Total score 25 23    Follows all commands speech and language fluent.   Cranial nerve II-XII: Pupils were equal round reactive to light extraocular movements were full, visual field were full on confrontational test. Facial sensation and strength were normal. hearing was intact  to finger rubbing bilaterally. Uvula tongue midline. head turning and shoulder shrug were normal and symmetric.Tongue protrusion into cheek strength was normal. Motor: normal bulk and tone, full strength in the BUE, BLE,  Sensory: normal and symmetric to light touch, pinprick, and  Vibration,   Coordination: finger-nose-finger, heel-to-shin bilaterally, no dysmetria Reflexes: Symmetric upper and lower, plantar responses were flexor bilaterally. Gait and Station: Rising up from seated position without assistance, narrow-base gait  smooth turning, able to perform tiptoe, and heel walking without difficulty. Tandem gait is steady Ambulates with single-point cane   DIAGNOSTIC DATA (LABS, IMAGING, TESTING) - I reviewed patient records, labs, notes, testing and imaging myself where available.  Lab Results  Component Value Date   WBC 13.6 (H) 09/20/2016   HGB 10.5 (L) 09/20/2016   HCT 30.4 (  L) 09/20/2016   MCV 95.6 09/20/2016   PLT 205 09/20/2016      Component Value Date/Time   NA 142 09/20/2016 0422   K 4.1 09/20/2016 0422   CL 107 09/20/2016 0422   CO2 26 09/20/2016 0422   GLUCOSE 86 09/20/2016 0422   BUN 15 09/20/2016 0422   CREATININE 0.67 09/20/2016 0422   CALCIUM 8.5 (L) 09/20/2016 0422   PROT 7.5 09/17/2016 2215   ALBUMIN 4.0 09/17/2016 2215   AST 30 09/17/2016 2215   ALT 24 09/17/2016 2215   ALKPHOS 84 09/17/2016 2215   BILITOT 0.2 (L) 09/17/2016 2215   GFRNONAA >60 09/20/2016 0422   GFRAA >60 09/20/2016 0422    Lab Results  Component Value Date   VITAMINB12 293 09/19/2016    ASSESSMENT AND PLAN 83 y.o. year old female here with gradual onset progressive short-term memory loss confusion, navigation difficulty, difficulty with ADLs (such as shopping, food preparation, bills and finances), most consistent with neurodegenerative dementia.  MMSE 25 out of 30 Animal fluency testing 12 [place] instrumental ADL 7 out of 10 Katz ADL 6 out of 6   Ddx: mild dementia vs  other secondary cause    PLAN: Check MRI brain will call daughter to schedule Continue safety / supervision consider memantine / donepezil in the future Continue - brain health activities  Use cane outside for safe ambulation F/U 6 months Nilda RiggsNancy Carolyn , [facility] DistrictGNP, [facility] LLCBC, APRN  [facility] [address] [place], [place] [id number] [phone]

## 2018-03-10 ENCOUNTER — Ambulatory Visit: Payer: Medicare HMO | Admitting: Nurse Practitioner

## 2018-03-10 ENCOUNTER — Encounter: Payer: Self-pay | Admitting: Nurse Practitioner

## 2018-03-10 DIAGNOSIS — R413 Other amnesia: Secondary | ICD-10-CM

## 2018-03-10 NOTE — Patient Instructions (Addendum)
Check MRI brain will call daughter to schedule Continue safety / supervision consider memantine / donepezil Continue - brain health activities reviewed Use cane for safe ambulation outside F/U 6 months

## 2018-04-11 ENCOUNTER — Telehealth: Payer: Self-pay | Admitting: Diagnostic Neuroimaging

## 2018-04-11 NOTE — Telephone Encounter (Signed)
[employer] medicare Kelly [name]: [id number] (exp. 04/10/18 to 07/09/18) patient is scheduled at [facility] for 04/14/18

## 2018-04-14 ENCOUNTER — Ambulatory Visit
Admission: RE | Admit: 2018-04-14 | Discharge: 2018-04-14 | Disposition: A | Discharge status: Home / Self Care | Payer: Medicare HMO | Source: Ambulatory Visit | Attending: Diagnostic Neuroimaging | Admitting: Diagnostic Neuroimaging

## 2018-04-14 DIAGNOSIS — R413 Other amnesia: Secondary | ICD-10-CM

## 2018-04-24 NOTE — Progress Notes (Signed)
I reviewed note and agree with plan.   Angelys Yetman R. Qamar Aughenbaugh, MD 

## 2018-09-09 ENCOUNTER — Telehealth: Payer: Self-pay | Admitting: *Deleted

## 2018-09-09 ENCOUNTER — Encounter: Payer: Self-pay | Admitting: *Deleted

## 2018-09-09 NOTE — Telephone Encounter (Signed)
Called daughter, Kelly [name] on [place] and advised Kelly that due to current COVID 19 pandemic, our office is severely reducing in person visits in order to minimize the risk to our patients and healthcare providers. We recommend to convert Kelly [name]'s appointment to a video visit. We'll take all precautions to reduce any security or privacy concerns. This will be treated like an office visit, and we will file with your insurance. Kelly [name] stated they have already done a video visit, and Kelly [name] would consent. She requested e mail be sent to Kelly: [email or web address]. Updated EMR. Kelly [name] will assist with video visit. Kelly [name] stated they never got results of MRI done earlier this year, and she would like Dr Marjory Lies to give results.  I advised Kelly [name]'s my chart would give results, but patient's has expired. I advised Kelly [name] to look up my chart [place] and reactivate. She  verbalized understanding, appreciation. E mail sent.

## 2018-09-10 ENCOUNTER — Other Ambulatory Visit: Payer: Self-pay

## 2018-09-10 ENCOUNTER — Ambulatory Visit (INDEPENDENT_AMBULATORY_CARE_PROVIDER_SITE_OTHER): Payer: Medicare HMO | Admitting: Diagnostic Neuroimaging

## 2018-09-10 ENCOUNTER — Encounter: Payer: Self-pay | Admitting: Diagnostic Neuroimaging

## 2018-09-10 DIAGNOSIS — F03A0 Unspecified dementia, mild, without behavioral disturbance, psychotic disturbance, mood disturbance, and anxiety: Secondary | ICD-10-CM

## 2018-09-10 DIAGNOSIS — F0390 Unspecified dementia without behavioral disturbance: Secondary | ICD-10-CM

## 2018-09-10 MED ORDER — MEMANTINE HCL 10 MG PO TABS: 10.0000 mg | ORAL_TABLET | Freq: Two times a day (BID) | ORAL | 12 refills | Status: DC

## 2018-09-10 NOTE — Progress Notes (Signed)
    Virtual Visit via Video Note  I connected with Kelly [name] on 09/10/18 at  1:30 PM EDT by a video enabled telemedicine application and verified that I am speaking with the correct person using two identifiers. Patient with daughter and health care aid.    I discussed the limitations of evaluation and management by telemedicine and the availability of in person appointments. The patient expressed understanding and agreed to proceed.  Patient is at their home. I am at the office.    History of Present Illness:  UPDATE (09/10/18, VRP): Since last visit, doing well. Some mild progression of sxs. Severity is mild. No alleviating or aggravating factors. Tolerating meds. Not driving.    Observations/Objective:  VIDEO EXAM  GENERAL EXAM/CONSTITUTIONAL:  Vitals: There were no vitals filed for this visit.  There is no height or weight on file to calculate BMI. Wt Readings from Last 3 Encounters:  03/10/18 155 lb 12.8 oz (70.7 kg)  08/26/17 156 lb 3.2 oz (70.9 kg)  09/17/16 140 lb (63.5 kg)     Patient is in no distress; well developed, nourished and groomed; neck is supple   NEUROLOGIC: MENTAL STATUS:  MMSE - Mini Mental State Exam 03/10/2018 08/26/2017  Orientation to time 5 5  Orientation to Place 3 3  Registration 3 3  Attention/ Calculation 4 2  Recall 1 2  Language- name 2 objects 2 2  Language- repeat 1 1  Language- follow 3 step command 3 3  Language- read & follow direction 1 1  Write a sentence 1 1  Copy design 1 0  Total score 25 23      04/14/18 MRI brain (without) [I reviewed images myself and agree with interpretation. -VRP]  - Moderate diffuse and severe mesial temporal atrophy.  - Mild periventricular and subcortical chronic small vessel ischemic disease.  - No acute findings.   Assessment and Plan:  Dx:  1. Mild dementia (HCC)      MEMORY LOSS (MILD DEMENTIA) - increase safety / supervision (has home aid) - start memantine - caregiver  resources reviewed - brain health activities reviewed - no driving  Meds ordered this encounter  Medications  . memantine (NAMENDA) 10 MG tablet    Sig: Take 1 tablet (10 mg total) by mouth 2 (two) times daily.    Dispense:  60 tablet    Refill:  12    Follow Up Instructions:  - Return for pending if symptoms worsen or fail to improve, return to PCP.   I discussed the assessment and treatment plan with the patient. The patient was provided an opportunity to ask questions and all were answered. The patient agreed with the plan and demonstrated an understanding of the instructions.   The patient was advised to call back or seek an in-person evaluation if the symptoms worsen or if the condition fails to improve as anticipated.  I provided 25 minutes of non-face-to-face time during this encounter.   Suanne Marker, MD 09/10/2018, 1:58 PM Certified in Neurology, Neurophysiology and Neuroimaging  [facility] [address] [place], [place] [address] [phone]

## 2018-10-03 ENCOUNTER — Other Ambulatory Visit: Payer: Self-pay | Admitting: Diagnostic Neuroimaging

## 2018-10-06 NOTE — Telephone Encounter (Signed)
Per request, Rx changed to 90 day supply with previous Rx details.

## 2018-12-05 ENCOUNTER — Other Ambulatory Visit: Payer: Self-pay | Admitting: Diagnostic Neuroimaging

## 2018-12-08 IMAGING — CR DG HIP (WITH OR WITHOUT PELVIS) 2-3V*L*
3 series · 3 of 3 positions shown · non-contrast
Comparison: None.

CLINICAL DATA: Fall with pain to the hip

EXAM:
DG HIP (WITH OR WITHOUT PELVIS) 2-3V LEFT

[t pelvis ap]
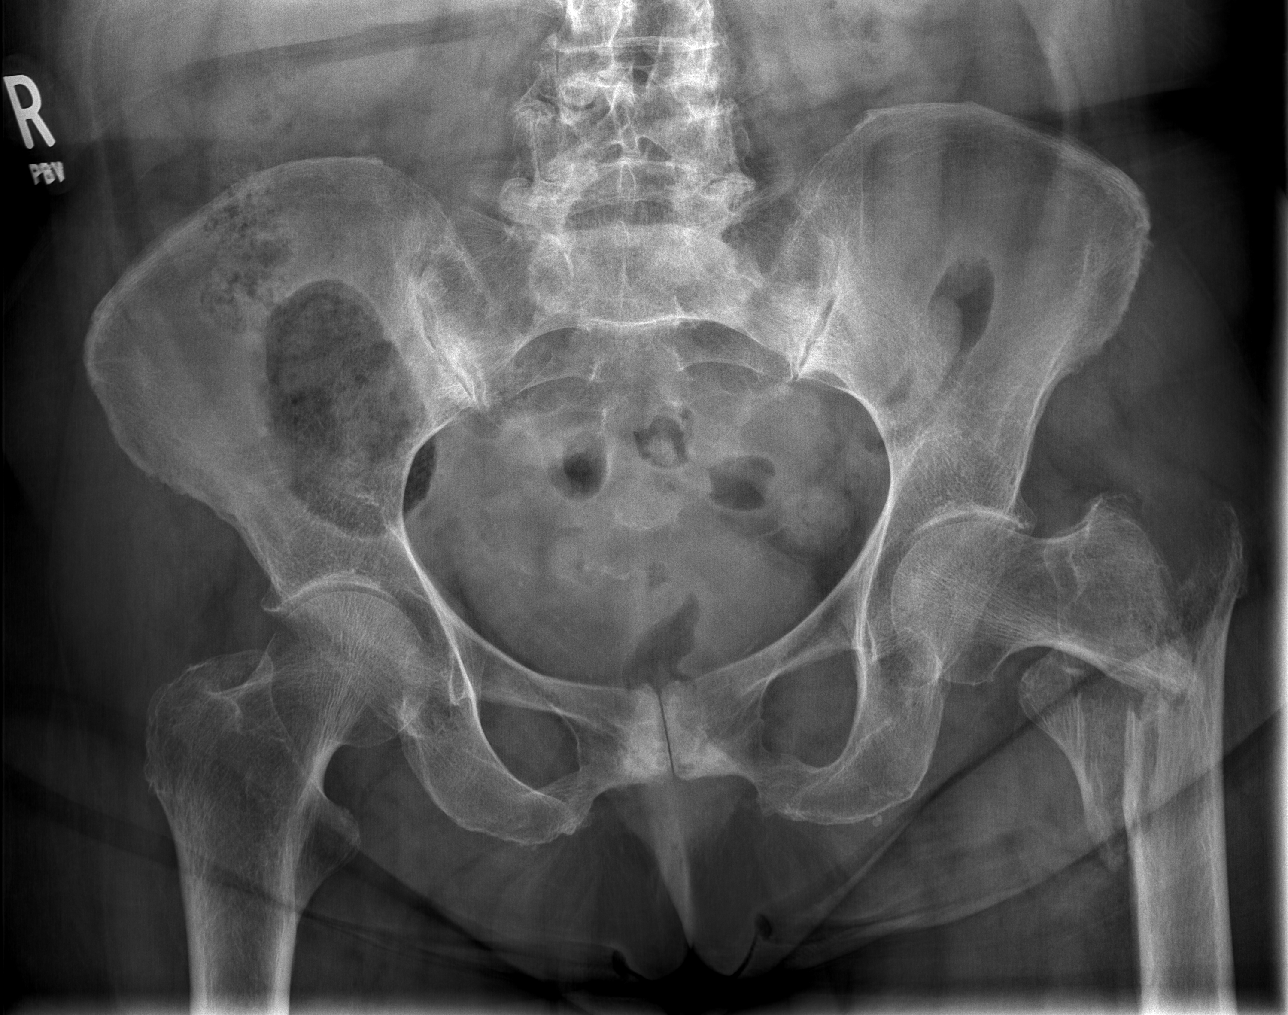

[t hip ap left]
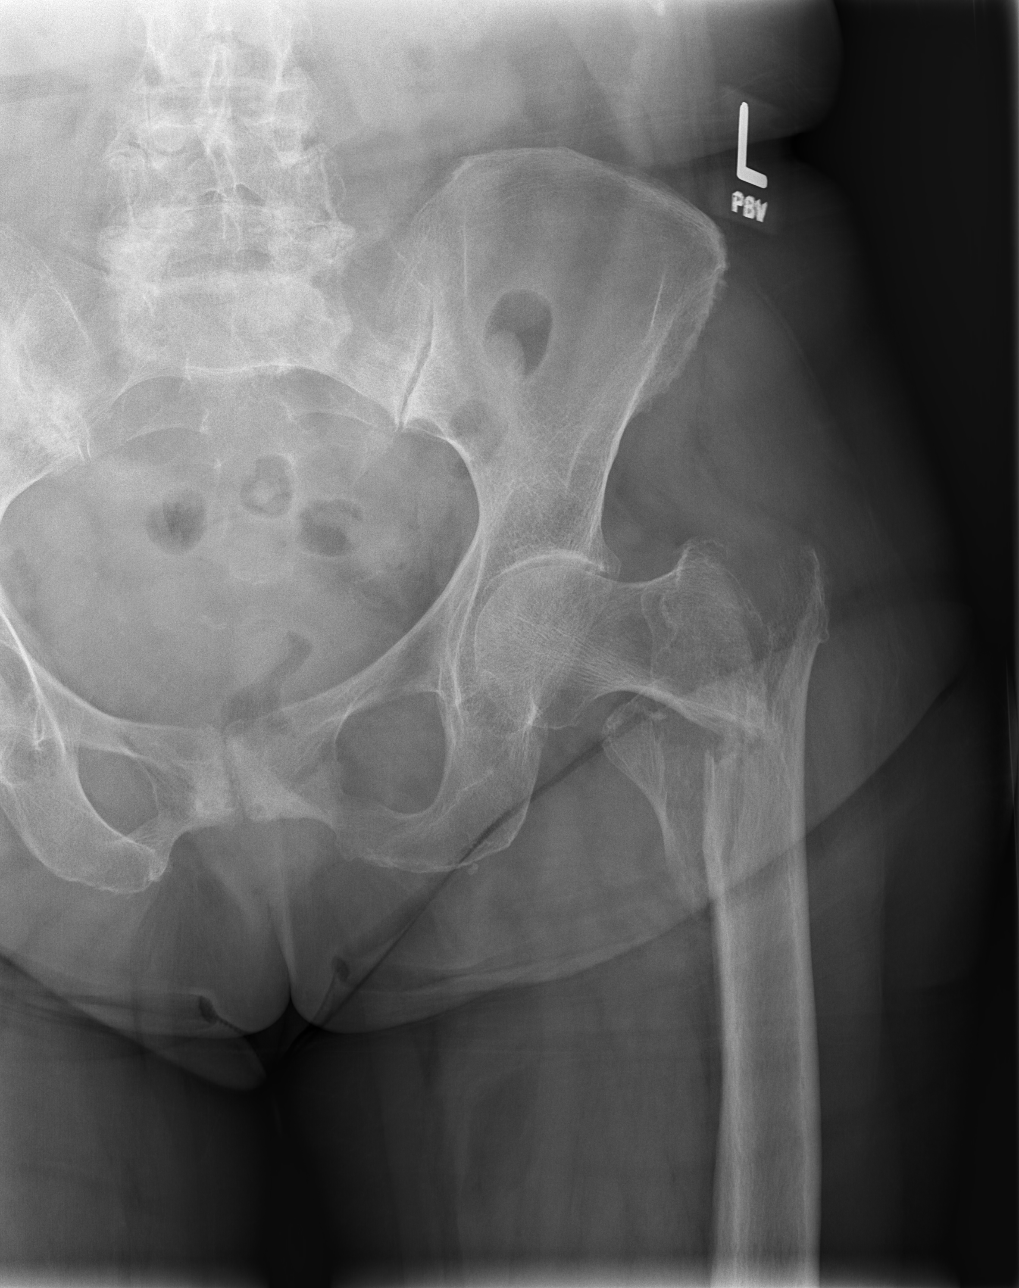

[w hip lat left]
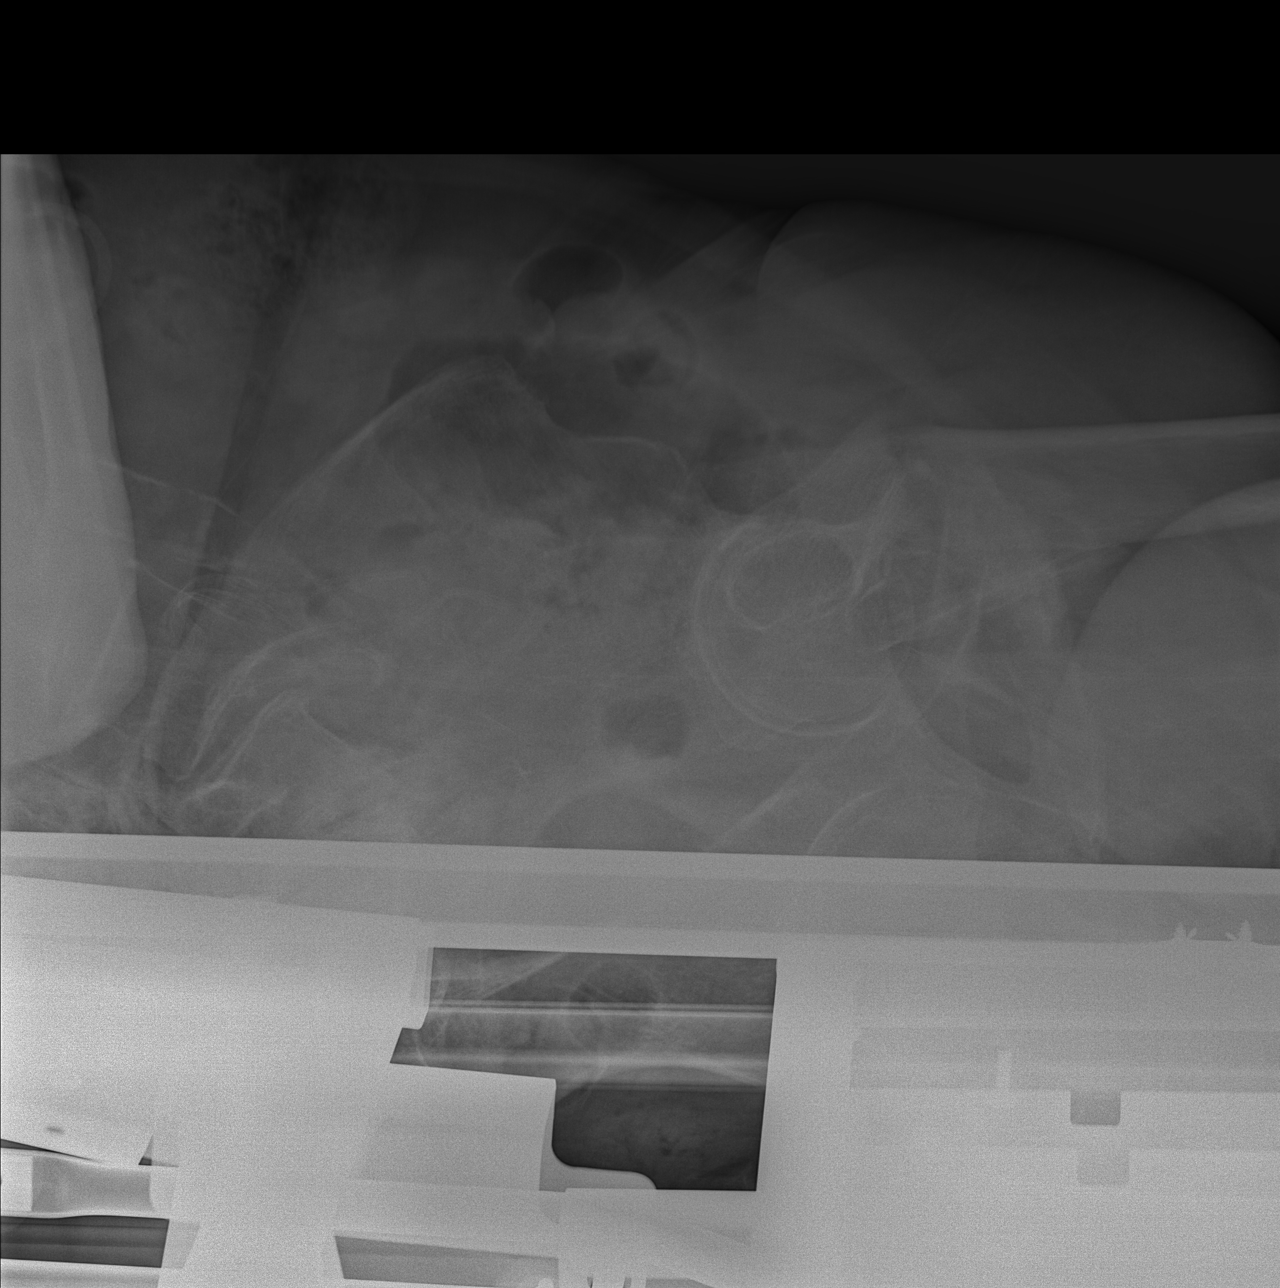

[3 of 3 positions shown; findings below may reference images not displayed]

FINDINGS: Sclerosis of the pubic symphysis. SI joints do not appear widened.
Mild arthritis right hip. The pubic rami appear intact.

Comminuted intertrochanteric fracture on the left with centrally
displaced lesser trochanteric fracture fragment toward the midline.
Femoral head is not dislocated.
IMPRESSION: Acute, comminuted and slightly distracted intertrochanteric fracture
on the left.

## 2019-01-12 ENCOUNTER — Other Ambulatory Visit: Payer: Self-pay | Admitting: Diagnostic Neuroimaging

## 2019-01-12 NOTE — Telephone Encounter (Signed)
Memantine refilled in June, 90 day supply w/refills. Refused refill request and added note to pharmacy.

## 2019-03-06 ENCOUNTER — Other Ambulatory Visit: Payer: Self-pay | Admitting: Diagnostic Neuroimaging

## 2019-04-03 ENCOUNTER — Other Ambulatory Visit: Payer: Self-pay | Admitting: Diagnostic Neuroimaging

## 2019-05-01 ENCOUNTER — Other Ambulatory Visit: Payer: Self-pay | Admitting: Diagnostic Neuroimaging

## 2019-11-01 ENCOUNTER — Other Ambulatory Visit: Payer: Self-pay | Admitting: Diagnostic Neuroimaging

## 2019-12-29 ENCOUNTER — Telehealth: Payer: Self-pay | Admitting: Diagnostic Neuroimaging

## 2019-12-29 MED ORDER — MEMANTINE HCL 10 MG PO TABS: 10.0000 mg | ORAL_TABLET | Freq: Two times a day (BID) | ORAL | 3 refills | Status: DC

## 2019-12-29 NOTE — Telephone Encounter (Signed)
Pt is needing a refill on her memantine (NAMENDA) 10 MG tablet sent in to the [facility] in [place] Pt has scheduled an appt with MD

## 2019-12-29 NOTE — Telephone Encounter (Signed)
Namenda refilled x 1 year.

## 2020-01-25 ENCOUNTER — Encounter: Payer: Self-pay | Admitting: Diagnostic Neuroimaging

## 2020-01-25 ENCOUNTER — Ambulatory Visit (INDEPENDENT_AMBULATORY_CARE_PROVIDER_SITE_OTHER): Payer: Medicare HMO | Admitting: Diagnostic Neuroimaging

## 2020-01-25 VITALS — BP 142/74 | HR 57 | Ht 65.0 in | Wt 147.0 lb

## 2020-01-25 DIAGNOSIS — R413 Other amnesia: Secondary | ICD-10-CM

## 2020-01-25 DIAGNOSIS — F0390 Unspecified dementia without behavioral disturbance: Secondary | ICD-10-CM | POA: Diagnosis not present

## 2020-01-25 DIAGNOSIS — F03A0 Unspecified dementia, mild, without behavioral disturbance, psychotic disturbance, mood disturbance, and anxiety: Secondary | ICD-10-CM

## 2020-01-25 MED ORDER — MEMANTINE HCL 10 MG PO TABS: 10.0000 mg | ORAL_TABLET | Freq: Two times a day (BID) | ORAL | 4 refills | Status: AC

## 2020-01-25 NOTE — Progress Notes (Signed)
Chief Complaint  Patient presents with  . Dementia    rm 7 FU to discuss meds, dgtrArmando Reichert    History of Present Illness:  UPDATE (01/25/20, VRP): Since last visit, doing well. Symptoms are stable. Severity is mild. No alleviating or aggravating factors. Tolerating meds.    UPDATE (09/10/18, VRP): Since last visit, doing well. Some mild progression of sxs. Severity is mild. No alleviating or aggravating factors. Tolerating meds. Not driving.  PRIOR HPI 08/26/17: 85 year old female here for evaluation of memory loss.  According to patient, she was referred here because she fell down in June 2018 and had a left hip fracture.  However according to patient's daughter and review of referring notes patient was referred here for memory loss of elevation.  For last 2 years patient had gradual onset progressive short-term memory loss, confusion, difficulty with navigation, difficulty with repeating herself, not being able to pare bills properly, having confusion when meeting with her accountant.  Patient also has been more agitated and aggravated lately.  She is more socially withdrawn.  Symptoms did worsen when patient underwent left hip fracture surgery in June 2018.  Since that time memory loss symptoms seem to have rapidly worsened.  There is family history of dementia and patient's mother and other family members.  Patient currently living alone.  Patient drives short distances.   Observations/Objective:  GENERAL EXAM/CONSTITUTIONAL: Vitals:  Vitals:   01/25/20 1623  BP: (!) 142/74  Pulse: (!) 57  Weight: 147 lb (66.7 kg)  Height: 5\' 5"  (1.651 m)     Body mass index is 24.46 kg/m. Wt Readings from Last 3 Encounters:  01/25/20 147 lb (66.7 kg)  03/10/18 155 lb 12.8 oz (70.7 kg)  08/26/17 156 lb 3.2 oz (70.9 kg)     Patient is in no distress; well developed, nourished and groomed; neck is supple  CARDIOVASCULAR:  Examination of carotid arteries is normal; no carotid  bruits  Regular rate and rhythm, no murmurs  Examination of peripheral vascular system by observation and palpation is normal  EYES:  Ophthalmoscopic exam of optic discs and posterior segments is normal; no papilledema or hemorrhages  No exam data present  MUSCULOSKELETAL:  Gait, strength, tone, movements noted in Neurologic exam below  NEUROLOGIC: MENTAL STATUS:  MMSE - Mini Mental State Exam 01/25/2020 03/10/2018 08/26/2017  Orientation to time 2 5 5   Orientation to Place 4 3 3   Registration 3 3 3   Attention/ Calculation 2 4 2   Recall 0 1 2  Language- name 2 objects 2 2 2   Language- repeat 0 1 1  Language- follow 3 step command 3 3 3   Language- read & follow direction 1 1 1   Write a sentence 1 1 1   Copy design 0 1 0  Total score 18 25 23     awake, alert, oriented to person, place and time  recent and remote memory intact  normal attention and concentration  language fluent, comprehension intact, naming intact  fund of knowledge appropriate  CRANIAL NERVE:   2nd - no papilledema on fundoscopic exam  2nd, 3rd, 4th, 6th - pupils equal and reactive to light, visual fields full to confrontation, extraocular muscles intact, no nystagmus  5th - facial sensation symmetric  7th - facial strength symmetric  8th - hearing intact  9th - palate elevates symmetrically, uvula midline  11th - shoulder shrug symmetric  12th - tongue protrusion midline  MOTOR:   normal bulk and tone, full strength in the  BUE, BLE  SENSORY:   normal and symmetric to light touch  COORDINATION:   finger-nose-finger, fine finger movements normal  REFLEXES:   deep tendon reflexes present and symmetric  GAIT/STATION:   narrow based gait     04/14/18 MRI brain (without) [I reviewed images myself and agree with interpretation. -VRP]  - Moderate diffuse and severe mesial temporal atrophy.  - Mild periventricular and subcortical chronic small vessel ischemic disease.  - No  acute findings.   Assessment and Plan:  Dx:  1. Mild dementia (HCC)   2. Memory loss     MEMORY LOSS (MILD DEMENTIA) - increase safety / supervision (has home aid) - continue memantine - caregiver resources reviewed - brain health activities reviewed - no driving  Meds ordered this encounter  Medications  . memantine (NAMENDA) 10 MG tablet    Sig: Take 1 tablet (10 mg total) by mouth 2 (two) times daily.    Dispense:  180 tablet    Refill:  4    Follow Up Instructions:  - Return for return to PCP, pending if symptoms worsen or fail to improve.     Suanne Marker, MD 01/25/2020, 4:28 PM Certified in Neurology, Neurophysiology and Neuroimaging  [facility] [address] [place], [place] [address] [phone]

## 2020-08-17 ENCOUNTER — Other Ambulatory Visit: Payer: Self-pay | Admitting: Diagnostic Neuroimaging

## 2020-11-10 ENCOUNTER — Other Ambulatory Visit: Payer: Self-pay | Admitting: Diagnostic Neuroimaging

## 2020-12-16 ENCOUNTER — Other Ambulatory Visit: Payer: Self-pay | Admitting: Diagnostic Neuroimaging

## 2021-01-18 ENCOUNTER — Other Ambulatory Visit: Payer: Self-pay | Admitting: Diagnostic Neuroimaging

## 2021-04-19 ENCOUNTER — Other Ambulatory Visit (HOSPITAL_BASED_OUTPATIENT_CLINIC_OR_DEPARTMENT_OTHER): Payer: Self-pay | Admitting: Family Medicine

## 2021-04-19 DIAGNOSIS — E559 Vitamin D deficiency, unspecified: Secondary | ICD-10-CM

## 2021-04-19 DIAGNOSIS — M8580 Other specified disorders of bone density and structure, unspecified site: Secondary | ICD-10-CM

## 2021-05-31 ENCOUNTER — Other Ambulatory Visit: Payer: Self-pay

## 2021-05-31 ENCOUNTER — Ambulatory Visit (HOSPITAL_BASED_OUTPATIENT_CLINIC_OR_DEPARTMENT_OTHER)
Admission: RE | Admit: 2021-05-31 | Discharge: 2021-05-31 | Disposition: A | Discharge status: Home / Self Care | Payer: Medicare HMO | Source: Ambulatory Visit | Attending: Family Medicine | Admitting: Family Medicine

## 2021-05-31 DIAGNOSIS — M8580 Other specified disorders of bone density and structure, unspecified site: Secondary | ICD-10-CM | POA: Diagnosis present

## 2021-05-31 DIAGNOSIS — Z13820 Encounter for screening for osteoporosis: Secondary | ICD-10-CM | POA: Diagnosis present

## 2021-05-31 DIAGNOSIS — E559 Vitamin D deficiency, unspecified: Secondary | ICD-10-CM | POA: Insufficient documentation

## 2021-05-31 DIAGNOSIS — Z780 Asymptomatic menopausal state: Secondary | ICD-10-CM | POA: Insufficient documentation

## 2022-11-29 ENCOUNTER — Emergency Department (HOSPITAL_BASED_OUTPATIENT_CLINIC_OR_DEPARTMENT_OTHER)
Admission: EM | Admit: 2022-11-29 | Discharge: 2022-11-29 | Disposition: A | Discharge status: Home / Self Care | Payer: Medicare HMO | Attending: Emergency Medicine | Admitting: Emergency Medicine

## 2022-11-29 ENCOUNTER — Emergency Department (HOSPITAL_BASED_OUTPATIENT_CLINIC_OR_DEPARTMENT_OTHER): Payer: Medicare HMO | Admitting: Radiology

## 2022-11-29 ENCOUNTER — Other Ambulatory Visit: Payer: Self-pay

## 2022-11-29 DIAGNOSIS — R079 Chest pain, unspecified: Secondary | ICD-10-CM | POA: Diagnosis present

## 2022-11-29 DIAGNOSIS — M542 Cervicalgia: Secondary | ICD-10-CM | POA: Diagnosis not present

## 2022-11-29 DIAGNOSIS — F0390 Unspecified dementia without behavioral disturbance: Secondary | ICD-10-CM | POA: Diagnosis not present

## 2022-11-29 LAB — BASIC METABOLIC PANEL
Anion gap: 9 (ref 5–15)
BUN: 10 mg/dL (ref 8–23)
CO2: 25 mmol/L (ref 22–32)
Calcium: 9.1 mg/dL (ref 8.9–10.3)
Chloride: 105 mmol/L (ref 98–111)
Creatinine, Ser: 0.8 mg/dL (ref 0.44–1.00)
GFR, Estimated: >60 mL/min (ref >60)
Glucose, Bld: 143 mg/dL — ABNORMAL HIGH (ref 70–99)
Potassium: 3.8 mmol/L (ref 3.5–5.1)
Sodium: 139 mmol/L (ref 135–145)

## 2022-11-29 LAB — CBC
HCT: 41.8 % (ref 36.0–46.0)
Hemoglobin: 14 g/dL (ref 12.0–15.0)
MCH: 33 pg (ref 26.0–34.0)
MCHC: 33.5 g/dL (ref 30.0–36.0)
MCV: 98.6 fL (ref 80.0–100.0)
Platelets: 261 10*3/uL (ref 150–400)
RBC: 4.24 MIL/uL (ref 3.87–5.11)
RDW: 12.4 % (ref 11.5–15.5)
WBC: 13.5 10*3/uL — ABNORMAL HIGH (ref 4.0–10.5)
nRBC: 0 % (ref 0.0–0.2)

## 2022-11-29 LAB — TROPONIN I (HIGH SENSITIVITY)
Troponin I (High Sensitivity): 3 ng/L (ref <18)
Troponin I (High Sensitivity): 3 ng/L (ref <18)

## 2022-11-29 NOTE — ED Provider Notes (Signed)
[place] [facility]PARTMENT AT [facility] Provider Note   CSN: [id number] Arrival date & time: 11/29/22  1540     History  Chief Complaint  Patient presents with   Chest Pain    Kelly [name] is a 88 y.o. female.  Patient to ED with her daughter for evaluation of pain that started in the left neck and extended to center chest. Symptoms started around 2:00 pm with neck pain and progressed to chest pain through the afternoon. She is currently pain free. History of dementia. She does not remember having any symptoms today - daughter provides reliable history. No cough, fever. No vomiting. No history of heart problems.    Chest Pain      Home Medications Prior to Admission medications   Medication Sig Start Date End Date Taking? Authorizing Provider  Ascorbic Acid (VITAMIN C) 100 MG tablet Take 100 mg by mouth daily.     [provider]  Calcium Carbonate Antacid (CALCIUM CARBONATE PO) Take by mouth. Dose unknown    [provider]  Cholecalciferol (VITAMIN D-1000 MAX ST) 1000 units tablet Take by mouth daily.     [provider]  losartan (COZAAR) 25 MG tablet Take 25 mg by mouth daily. 08/13/17   [provider]  memantine (NAMENDA) 10 MG tablet Take 1 tablet (10 mg total) by mouth 2 (two) times daily. 01/25/20   Penumalli, Glenford Bayley, MD  Multiple Minerals-Vitamins (CALCIUM-MAGNESIUM-ZINC-D3 PO) Take by mouth daily.    [provider]      Allergies    Patient has no known allergies.    Review of Systems   Review of Systems  Cardiovascular:  Positive for chest pain.    Physical Exam Updated Vital Signs BP 129/72   Pulse 63   Temp 98.2 F (36.8 C) (Oral)   Resp 12   Ht 5\' 5"  (1.651 m)   Wt 68 kg   SpO2 98%   BMI 24.96 kg/m  Physical Exam Vitals and nursing note reviewed.  Constitutional:      General: She is not in acute distress.    Appearance: She is well-developed.  Neck:     Vascular: No JVD.      Comments: No reproducible tenderness of cervical neck or lateral musculature. No swelling.  Cardiovascular:     Rate and Rhythm: Normal rate and regular rhythm.     Comments: No carotid bruit Pulmonary:     Effort: Pulmonary effort is normal.     Breath sounds: Normal breath sounds.     Comments: No chest wall tenderness.  Abdominal:     General: Bowel sounds are normal.     Tenderness: There is no abdominal tenderness.  Musculoskeletal:     Cervical back: Normal range of motion and neck supple.     Right lower leg: No edema.     Left lower leg: No edema.  Skin:    General: Skin is warm and dry.  Neurological:     Mental Status: She is alert.     ED Results / Procedures / Treatments   Labs (all labs ordered are listed, but only abnormal results are displayed) Labs Reviewed  BASIC METABOLIC PANEL - Abnormal; Notable for the following components:      Result Value   Glucose, Bld 143 (*)    All other components within normal limits  CBC - Abnormal; Notable for the following components:   WBC 13.5 (*)    All other components within normal  limits  TROPONIN I (HIGH SENSITIVITY)  TROPONIN I (HIGH SENSITIVITY)    EKG EKG Interpretation Date/Time:  Thursday November 29 2022 16:01:36 EDT Ventricular Rate:  71 PR Interval:  170 QRS Duration:  120 QT Interval:  406 QTC Calculation: 441 R Axis:   -13  Text Interpretation: Sinus rhythm with occasional Premature ventricular complexes Right bundle branch block Abnormal ECG When compared with ECG of 17-Sep-2016 22:22, PREVIOUS ECG IS PRESENT similar to previous Confirmed by Coralee Pesa [phone]) on 11/29/2022 4:56:17 PM  Radiology DG Chest 2 View  Result Date: 11/29/2022 CLINICAL DATA:  Chest pain EXAM: CHEST - 2 VIEW COMPARISON:  X-ray 09/17/2016 FINDINGS: Slight blunting of the left costophrenic angle. Tiny effusion versus pleural thickening. No consolidation, pneumothorax or effusion. No edema. Normal cardiopericardial silhouette  degenerative changes seen along the spine. IMPRESSION: Tiny left effusion.  No consolidation or edema. Electronically Signed   By: Karen Kays M.D.   On: 11/29/2022 17:13    Procedures Procedures    Medications Ordered in ED Medications - No data to display  ED Course/ Medical Decision Making/ A&P                                 Medical Decision Making Patient with history of dementia presents for evaluation of chest pain and left lateral neck pain earlier today. Now asymptomatic.   Amount and/or Complexity of Data Reviewed Labs: ordered. Decision-making details documented in ED Course.    Details: Troponin x 2 negative. Mild leukocytosis of 13.5, nonspecific.  Radiology: ordered and independent interpretation performed.    Details: No infiltrates, PTX or significant edema Per radiology - tiny effusion.  ECG/medicine tests: ordered and independent interpretation performed.    Details: No ischemic changes Discussion of management or test interpretation with external provider(s): No evidence of cardiac event, infection, PE, dissection. Currently symptomatic with negative work up. She can be discharged home with prn PCP follow up.            Final Clinical Impression(s) / ED Diagnoses Final diagnoses:  Nonspecific chest pain    Rx / DC Orders ED Discharge Orders     None         Elpidio Anis, PA-C 11/29/22 1943    Rozelle Logan, DO 11/29/22 2237

## 2022-11-29 NOTE — ED Triage Notes (Signed)
Arrive POV from home. A+Ox4.   1430 neck pain started. Laid down shortly after and was feeling chest pain. Upon arrival to triage pt states she has no pain anywhere at this time. No pain through ROM of neck. Some chest central pressure with deep breath.   HX dementia.

## 2022-11-29 NOTE — Discharge Instructions (Signed)
Return to the ED with any new or concerning symptoms at any time.  

## 2023-09-27 ENCOUNTER — Emergency Department (HOSPITAL_BASED_OUTPATIENT_CLINIC_OR_DEPARTMENT_OTHER)

## 2023-09-27 ENCOUNTER — Other Ambulatory Visit: Payer: Self-pay

## 2023-09-27 ENCOUNTER — Inpatient Hospital Stay (HOSPITAL_BASED_OUTPATIENT_CLINIC_OR_DEPARTMENT_OTHER)
Admission: EM | Admit: 2023-09-27 | Discharge: 2023-10-01 | DRG: 755 | Disposition: A | Discharge status: Home / Self Care | Attending: Family Medicine | Admitting: Family Medicine

## 2023-09-27 ENCOUNTER — Encounter (HOSPITAL_BASED_OUTPATIENT_CLINIC_OR_DEPARTMENT_OTHER): Payer: Self-pay

## 2023-09-27 DIAGNOSIS — K8020 Calculus of gallbladder without cholecystitis without obstruction: Secondary | ICD-10-CM | POA: Diagnosis present

## 2023-09-27 DIAGNOSIS — Z7722 Contact with and (suspected) exposure to environmental tobacco smoke (acute) (chronic): Secondary | ICD-10-CM | POA: Diagnosis present

## 2023-09-27 DIAGNOSIS — E860 Dehydration: Secondary | ICD-10-CM | POA: Diagnosis present

## 2023-09-27 DIAGNOSIS — A4150 Gram-negative sepsis, unspecified: Secondary | ICD-10-CM | POA: Diagnosis present

## 2023-09-27 DIAGNOSIS — R109 Unspecified abdominal pain: Secondary | ICD-10-CM | POA: Diagnosis not present

## 2023-09-27 DIAGNOSIS — Z79899 Other long term (current) drug therapy: Secondary | ICD-10-CM

## 2023-09-27 DIAGNOSIS — I10 Essential (primary) hypertension: Secondary | ICD-10-CM | POA: Diagnosis present

## 2023-09-27 DIAGNOSIS — F0390 Unspecified dementia without behavioral disturbance: Secondary | ICD-10-CM | POA: Diagnosis present

## 2023-09-27 DIAGNOSIS — Z8249 Family history of ischemic heart disease and other diseases of the circulatory system: Secondary | ICD-10-CM

## 2023-09-27 DIAGNOSIS — R971 Elevated cancer antigen 125 [CA 125]: Secondary | ICD-10-CM | POA: Diagnosis present

## 2023-09-27 DIAGNOSIS — C578 Malignant neoplasm of overlapping sites of female genital organs: Principal | ICD-10-CM | POA: Diagnosis present

## 2023-09-27 DIAGNOSIS — I959 Hypotension, unspecified: Secondary | ICD-10-CM | POA: Diagnosis present

## 2023-09-27 DIAGNOSIS — Z822 Family history of deafness and hearing loss: Secondary | ICD-10-CM

## 2023-09-27 DIAGNOSIS — R180 Malignant ascites: Secondary | ICD-10-CM | POA: Diagnosis present

## 2023-09-27 DIAGNOSIS — S72142A Displaced intertrochanteric fracture of left femur, initial encounter for closed fracture: Secondary | ICD-10-CM

## 2023-09-27 DIAGNOSIS — N390 Urinary tract infection, site not specified: Secondary | ICD-10-CM | POA: Diagnosis present

## 2023-09-27 DIAGNOSIS — D72829 Elevated white blood cell count, unspecified: Secondary | ICD-10-CM | POA: Diagnosis present

## 2023-09-27 DIAGNOSIS — R031 Nonspecific low blood-pressure reading: Secondary | ICD-10-CM | POA: Diagnosis present

## 2023-09-27 DIAGNOSIS — R1084 Generalized abdominal pain: Principal | ICD-10-CM

## 2023-09-27 DIAGNOSIS — R1900 Intra-abdominal and pelvic swelling, mass and lump, unspecified site: Secondary | ICD-10-CM | POA: Diagnosis present

## 2023-09-27 DIAGNOSIS — N3001 Acute cystitis with hematuria: Secondary | ICD-10-CM

## 2023-09-27 DIAGNOSIS — F03B18 Unspecified dementia, moderate, with other behavioral disturbance: Secondary | ICD-10-CM | POA: Diagnosis present

## 2023-09-27 DIAGNOSIS — Z66 Do not resuscitate: Secondary | ICD-10-CM | POA: Diagnosis present

## 2023-09-27 DIAGNOSIS — K760 Fatty (change of) liver, not elsewhere classified: Secondary | ICD-10-CM | POA: Diagnosis present

## 2023-09-27 LAB — URINALYSIS, ROUTINE W REFLEX MICROSCOPIC
Bilirubin Urine: NEGATIVE
Glucose, UA: NEGATIVE mg/dL
Ketones, ur: NEGATIVE mg/dL
Nitrite: NEGATIVE
Protein, ur: 30 mg/dL — AB
Specific Gravity, Urine: >1.046 — ABNORMAL HIGH (ref 1.005–1.030)
pH: 6.5 (ref 5.0–8.0)

## 2023-09-27 LAB — COMPREHENSIVE METABOLIC PANEL WITH GFR
ALT: 10 U/L (ref 0–44)
AST: 31 U/L (ref 15–41)
Albumin: 3.7 g/dL (ref 3.5–5.0)
Alkaline Phosphatase: 89 U/L (ref 38–126)
Anion gap: 12 (ref 5–15)
BUN: 16 mg/dL (ref 8–23)
CO2: 21 mmol/L — ABNORMAL LOW (ref 22–32)
Calcium: 9.4 mg/dL (ref 8.9–10.3)
Chloride: 106 mmol/L (ref 98–111)
Creatinine, Ser: 0.94 mg/dL (ref 0.44–1.00)
GFR, Estimated: 59 mL/min — ABNORMAL LOW (ref >60)
Glucose, Bld: 116 mg/dL — ABNORMAL HIGH (ref 70–99)
Potassium: 4.3 mmol/L (ref 3.5–5.1)
Sodium: 139 mmol/L (ref 135–145)
Total Bilirubin: 0.7 mg/dL (ref 0.0–1.2)
Total Protein: 7.4 g/dL (ref 6.5–8.1)

## 2023-09-27 LAB — LIPASE, BLOOD: Lipase: 18 U/L (ref 11–51)

## 2023-09-27 LAB — LACTIC ACID, PLASMA: Lactic Acid, Venous: 1.4 mmol/L (ref 0.5–1.9)

## 2023-09-27 LAB — CBC
HCT: 41.5 % (ref 36.0–46.0)
Hemoglobin: 14 g/dL (ref 12.0–15.0)
MCH: 32.5 pg (ref 26.0–34.0)
MCHC: 33.7 g/dL (ref 30.0–36.0)
MCV: 96.3 fL (ref 80.0–100.0)
Platelets: 325 10*3/uL (ref 150–400)
RBC: 4.31 MIL/uL (ref 3.87–5.11)
RDW: 12.7 % (ref 11.5–15.5)
WBC: 22 10*3/uL — ABNORMAL HIGH (ref 4.0–10.5)
nRBC: 0 % (ref 0.0–0.2)

## 2023-09-27 MED ORDER — SODIUM CHLORIDE 0.9 % IV SOLN
1.0000 g | Freq: Once | INTRAVENOUS | Status: AC
  Administered 2023-09-27: 1 g via INTRAVENOUS
  Filled 2023-09-27: qty 10

## 2023-09-27 MED ORDER — METRONIDAZOLE 500 MG/100ML IV SOLN
500.0000 mg | Freq: Once | INTRAVENOUS | Status: AC
  Administered 2023-09-28: 500 mg via INTRAVENOUS
  Filled 2023-09-27: qty 100

## 2023-09-27 MED ORDER — ACETAMINOPHEN 325 MG PO TABS
650.0000 mg | ORAL_TABLET | Freq: Once | ORAL | Status: AC
  Administered 2023-09-27: 650 mg via ORAL
  Filled 2023-09-27: qty 2

## 2023-09-27 MED ORDER — IOHEXOL 300 MG/ML  SOLN
100.0000 mL | Freq: Once | INTRAMUSCULAR | Status: AC | PRN
  Administered 2023-09-27: 100 mL via INTRAVENOUS

## 2023-09-27 NOTE — ED Notes (Signed)
 Pt unable to urinate. Daughter will let us  know

## 2023-09-27 NOTE — ED Notes (Signed)
 Patient transported to CT

## 2023-09-27 NOTE — ED Triage Notes (Signed)
 Pt c/o abd pain onset yesterday/ last night. Had a bowel movement this morning, we think it made it a little better. Was riding around, drove over railroad tracks & c/o pain again.   Denies GI hx

## 2023-09-27 NOTE — ED Notes (Signed)
 Put pt on BSC did not urinate.

## 2023-09-27 NOTE — ED Provider Notes (Signed)
 [place] EMERGENCY DEPARTMENT AT [facility] Provider Note  CSN: [id number] Arrival date & time: 09/27/23 1531  Chief Complaint(s) Abdominal Pain  HPI Kelly [name] is a 89 y.o. female with PMHx dementia and HTN presenting with abdominal pain.  Acute onset abdominal pain that began last night, described as generalized with patient clutching her lower abdomen.  Reports she had difficulty sleeping then eventually went to sleep and had a BM this morning reportedly improved her pain.  Reports she went out this morning and when driving over railroad tracks the jostling pain caused severe pain and she was yelling out.  Reports her pain has mostly resolved since that time.  Independent with feeding herself but has full-time caregiver.  Wears depends, no history of recurrent UTI.  No other prior GI history.  Denies fever.  Denies sick contacts.  Denies N/V/D.  Denies chest pain or shortness of breath.  Patient reports she is mostly asymptomatic at rest during exam.  Patient with limited participation in examination, history primarily provided by daughter.  Past Medical History Past Medical History:  Diagnosis Date   Hypertension    Memory loss    Patient Active Problem List   Diagnosis Date Noted   Memory loss 03/10/2018   Closed intertrochanteric fracture of left hip, initial encounter (HCC) 09/18/2016   Closed comminuted intertrochanteric fracture of left femur (HCC) 09/18/2016   Home Medication(s) Prior to Admission medications   Medication Sig Start Date End Date Taking? Authorizing Provider  Ascorbic Acid  (VITAMIN C ) 100 MG tablet Take 100 mg by mouth daily.     [provider]  Calcium Carbonate Antacid (CALCIUM CARBONATE PO) Take by mouth. Dose unknown    [provider]  Cholecalciferol  (VITAMIN D-1000 MAX ST) 1000 units tablet Take by mouth daily.     [provider]  losartan (COZAAR) 25 MG tablet Take 25 mg by mouth daily. 08/13/17   [provider]  memantine  (NAMENDA ) 10 MG tablet Take 1 tablet (10 mg total) by mouth 2 (two) times daily. 01/25/20   Penumalli, Vikram R, MD  Multiple Minerals-Vitamins (CALCIUM-MAGNESIUM-ZINC-D3 PO) Take by mouth daily.    [provider]                                                                                                                                    Past Surgical History Past Surgical History:  Procedure Laterality Date   INTRAMEDULLARY (IM) NAIL INTERTROCHANTERIC Left 09/18/2016   Procedure: INTRAMEDULLARY (IM) NAIL INTERTROCHANTRIC;  Surgeon: Fidel Rogue, MD;  Location: [facility];  Service: Orthopedics;  Laterality: Left;   Family History Family History  Problem Relation Age of Onset   Hypertension Mother    Heart failure Mother    Hearing loss Father    Aneurysm Father        brain   Breast cancer Neg Hx     Social History Social History  Tobacco Use   Smoking status: Passive Smoke Exposure - Never Smoker   Smokeless tobacco: Never  Substance Use Topics   Alcohol  use: No   Drug use: Never   Allergies Patient has no known allergies.  Review of Systems A thorough review of systems was obtained and all systems are negative except as noted in the HPI and PMH.   Physical Exam Vital Signs  I have reviewed the triage vital signs BP (!) 112/59   Pulse 77   Temp (!) 100.5 F (38.1 C) (Rectal)   Resp 18   SpO2 93%  Physical Exam  General: Elderly female sitting up bed, looking on the room during examination.  NAD Eyes: PERRLA, anicteric sclera ENTM: Dry lips.  Nonerythematous pharynx.  No congestion or rhinorrhea noted. Neck: Supple, non-tender Cardiovascular: RRR without murmur Respiratory: CTAB. Normal WOB on RA Gastrointestinal: Soft, nondistended.  Diffusely tender to palpation worst over epigastric and left upper quadrant.  + CVA tenderness bilaterally, right worse than left. MSK: No peripheral edema Derm: Warm, dry, no rashes  noted Neuro: CN intact. Motor and sensation intact globally Psych: Pleasantly demented.  Oriented to self and knows her daughter.  ED Results and Treatments Labs (all labs ordered are listed, but only abnormal results are displayed) Labs Reviewed  COMPREHENSIVE METABOLIC PANEL WITH GFR - Abnormal; Notable for the following components:      Result Value   CO2 21 (*)    Glucose, Bld 116 (*)    GFR, Estimated 59 (*)    All other components within normal limits  CBC - Abnormal; Notable for the following components:   WBC 22.0 (*)    All other components within normal limits  URINALYSIS, ROUTINE W REFLEX MICROSCOPIC - Abnormal; Notable for the following components:   Specific Gravity, Urine >1.046 (*)    Hgb urine dipstick MODERATE (*)    Protein, ur 30 (*)    Leukocytes,Ua SMALL (*)    Bacteria, UA RARE (*)    All other components within normal limits  LIPASE, BLOOD  LACTIC ACID, PLASMA  LACTIC ACID, PLASMA                                                                                                                          Radiology US  Abdomen Limited RUQ (LIVER/GB) Result Date: 09/27/2023 CLINICAL DATA:  151471 RUQ pain 151471 EXAM: ULTRASOUND ABDOMEN LIMITED RIGHT UPPER QUADRANT COMPARISON:  None Available. FINDINGS: Gallbladder: Several shadowing gallstones. No pericholecystic fluid or wall thickening. Common bile duct: Diameter: 0.2 cm. Liver: Liver is hyperechoic consistent with fatty infiltration. No focal hepatic lesions identified. No intrahepatic ductal dilatation. Hepatopetal portal vein flow. Trace perihepatic ascites. IMPRESSION: 1. Cholelithiasis. 2. Fatty infiltration of the liver. 3. Trace perihepatic ascites. Electronically Signed   By: Fonda Field M.D.   On: 09/27/2023 20:08   CT ABDOMEN PELVIS W CONTRAST Result Date: 09/27/2023 CLINICAL DATA:  Generalized abdominal pain. * Tracking Code: BO * EXAM: CT ABDOMEN  AND PELVIS WITH CONTRAST TECHNIQUE: Multidetector CT  imaging of the abdomen and pelvis was performed using the standard protocol following bolus administration of intravenous contrast. RADIATION DOSE REDUCTION: This exam was performed according to the departmental dose-optimization program which includes automated exposure control, adjustment of the mA and/or kV according to patient size and/or use of iterative reconstruction technique. CONTRAST:  OMNIPAQUE  IOHEXOL  300 MG/ML  SOLN COMPARISON:  None Available. FINDINGS: Lower chest: Bibasilar scarring mild cardiomegaly, without pericardial or pleural effusion. Moderate hiatal hernia. Hepatobiliary: Normal liver. Multiple gallstones on the order of 6 mm without acute cholecystitis or biliary duct dilatation. Pancreas: Normal, without mass or ductal dilatation. Spleen: Normal in size, without focal abnormality. Adrenals/Urinary Tract: Normal adrenal glands. Left renal too small to characterize lesions are most likely cysts . In the absence of clinically indicated signs/symptoms require(s) no independent follow-up. Normal right kidney. No hydronephrosis. Normal urinary bladder. Stomach/Bowel: Normal remainder of the stomach. Normal colon and terminal ileum. Appendix not visualized. Normal small bowel. Vascular/Lymphatic: Aortic atherosclerosis. No abdominopelvic adenopathy. Reproductive: The uterus has a diffusely abnormal morphology, with soft tissue fullness throughout. Example cervical enlargement at 5.5 x 5.6 cm on 73/3. This is contiguous with a macrolobulated left upper pelvic/adnexal heterogeneous mass of 10.2 x 8.8 cm on 62/3. Other: Pelvic floor laxity. Small volume abdominopelvic ascites. No well-defined omental/peritoneal metastasis. Musculoskeletal: Osteopenia. Left proximal femur fixation. Grade 1 L4-5 anterolisthesis. Mild convex left lumbar spine curvature. IMPRESSION: 1. Diffusely abnormal appearance of the uterus, contiguous with a left pelvic/adnexal dominant mass. Findings are favored to represent  either cervical/uterine primary with extension into the left adnexa or synchronous cervical/uterine and ovarian primaries. Recommend gynecological consultation. 2. Small volume abdominopelvic ascites, without evidence of peritoneal metastasis. 3. No bowel obstruction or other acute complication. 4. Incidental findings, including: Moderate hiatal hernia. Cholelithiasis. Pelvic floor laxity. Electronically Signed   By: Rockey Kilts M.D.   On: 09/27/2023 18:27    Pertinent labs & imaging results that were available during my care of the patient were reviewed by me and considered in my medical decision making (see MDM for details).  Medications Ordered in ED Medications  cefTRIAXone  (ROCEPHIN ) 1 g in sodium chloride  0.9 % 100 mL IVPB (has no administration in time range)  metroNIDAZOLE  (FLAGYL ) IVPB 500 mg (has no administration in time range)  iohexol  (OMNIPAQUE ) 300 MG/ML solution 100 mL (100 mLs Intravenous Contrast Given 09/27/23 1758)  acetaminophen  (TYLENOL ) tablet 650 mg (650 mg Oral Given 09/27/23 2217)                                                                   Medical Decision Making / ED Course    Medical Decision Making:    Lorenda Grecco is a 89 y.o. female with PMHx dementia and HTN presenting with abdominal pain.. The complaint involves an extensive differential diagnosis and also carries with it a high risk of complications and morbidity.  Serious etiology was considered. Ddx includes but is not limited to: Pyelonephritis, UTI, diverticulitis, pancreatitis, cholecystitis, ACS  Complete initial physical exam performed, notably the patient was in no distress.    Reviewed and confirmed nursing documentation for past medical history, family history, social history.  Vital signs reviewed.     Brief summary:  89 year old female with history as above presenting with acute onset generalized abdominal pain worse in epigastric and left upper quadrant regions.  Given history of  dementia patient minimally interactive but diffusely tender abdominal pain on exam and leukocytosis concerning for underlying acute intra-abdominal pathology.  Will obtain CTAP with contrast to evaluate further and obtain urine studies.  Clinical Course as of 09/27/23 2305  Fri Sep 27, 2023  1911 CTAP concerning for uterine/ovarian malignancy and small volume ascites without peritoneal mets.  Multiple gallstones, will obtain RUQ to rule out obstructive pathology given CTAP results not fully explaining generalized tenderness upon presentation.  Also pending urine sample, will obtain I&O cath.  Temp 99.7, will obtain rectal temp.  Plan to reach out to OB/GYN to discuss case further. [KH]  2022 Urine sample concerning for UTI.  RUQ negative for acute obstruction, gallbladder etiology less likely.  Will reach out to GYN to discuss role of GYN cancer and presentation with possible need for admission for antibiotics. [KH]  2124 Spoke with GYN on-call Dr. Erik, recommended patient be considered for medical admission given nonspecific constellation of symptoms.  Would recommend IR drainage of ascites with cytology to further assess for malignancy and obtaining CA125.  Now febrile with rectal temp 100.5, given concern for UTI versus peritonitis recommend admission. [KH]  2304 Given Tylenol  and initiated on CTX and Flagyl . Admit to [facility] med tele. [KH]    Clinical Course User Index [KH] Theophilus Pagan, MD     Additional history obtained: -Additional history obtained from family -External records from outside source obtained and reviewed including: Chart review including previous notes, labs, imaging, consultation notes including: OP PCP notes   Lab Tests: -I ordered, reviewed, and interpreted labs.   The pertinent results include:   Labs Reviewed  COMPREHENSIVE METABOLIC PANEL WITH GFR - Abnormal; Notable for the following components:      Result Value   CO2 21 (*)    Glucose, Bld 116 (*)     GFR, Estimated 59 (*)    All other components within normal limits  CBC - Abnormal; Notable for the following components:   WBC 22.0 (*)    All other components within normal limits  URINALYSIS, ROUTINE W REFLEX MICROSCOPIC - Abnormal; Notable for the following components:   Specific Gravity, Urine >1.046 (*)    Hgb urine dipstick MODERATE (*)    Protein, ur 30 (*)    Leukocytes,Ua SMALL (*)    Bacteria, UA RARE (*)    All other components within normal limits  LIPASE, BLOOD  LACTIC ACID, PLASMA  LACTIC ACID, PLASMA     EKG   EKG Interpretation Date/Time:  Friday September 27 2023 16:44:36 EDT Ventricular Rate:  89 PR Interval:  164 QRS Duration:  128 QT Interval:  367 QTC Calculation: 447 R Axis:   -36  Text Interpretation: Sinus rhythm Right bundle branch block LVH by voltage Anterolateral infarct, age indeterminate when compared to prior, faster rate No STEMI Confirmed by Ginger Barefoot ([id number]) on 09/27/2023 8:18:53 PM         Imaging Studies ordered: I ordered imaging studies including CTAP I independently visualized the following imaging with scope of interpretation limited to determining acute life threatening conditions related to emergency care; findings noted above I agree with the radiologist interpretation If any imaging was obtained with contrast I closely monitored patient for any possible adverse reaction a/w contrast administration in the emergency department   Medicines ordered and prescription drug management: Meds  ordered this encounter  Medications   iohexol  (OMNIPAQUE ) 300 MG/ML solution 100 mL   acetaminophen  (TYLENOL ) tablet 650 mg   cefTRIAXone  (ROCEPHIN ) 1 g in sodium chloride  0.9 % 100 mL IVPB    Antibiotic Indication::   UTI   metroNIDAZOLE  (FLAGYL ) IVPB 500 mg    Antibiotic Indication::   Intra-abdominal Infection    -I have reviewed the patients home medicines and have made adjustments as needed   Consultations Obtained: I requested  consultation with the hospitalist,  and discussed lab and imaging findings as well as pertinent plan - they recommend: admission to [facility] med tele   Cardiac Monitoring: The patient was maintained on a cardiac monitor.  I personally viewed and interpreted the cardiac monitored which showed an underlying rhythm of: NSR Continuous pulse oximetry interpreted by myself, 96% on RA.    Reevaluation: After the interventions noted above, I reevaluated the patient and found that they have stayed the same  Co morbidities that complicate the patient evaluation  Past Medical History:  Diagnosis Date   Hypertension    Memory loss       Dispostion: Disposition decision including need for hospitalization was considered, and patient admitted to the hospital.    Final Clinical Impression(s) / ED Diagnoses Final diagnoses:  None        Theophilus Pagan, MD 09/27/23 2305    Tegeler, Lonni PARAS, MD 09/30/23 1451

## 2023-09-27 NOTE — ED Notes (Signed)
 Pt unable to provide urine sample at this time

## 2023-09-27 NOTE — ED Notes (Signed)
 ED Provider at bedside.

## 2023-09-27 NOTE — ED Notes (Addendum)
 MD made aware of rectal temp 100.5

## 2023-09-28 ENCOUNTER — Encounter (HOSPITAL_COMMUNITY): Payer: Self-pay | Admitting: Family Medicine

## 2023-09-28 ENCOUNTER — Inpatient Hospital Stay (HOSPITAL_COMMUNITY)

## 2023-09-28 DIAGNOSIS — N3001 Acute cystitis with hematuria: Secondary | ICD-10-CM

## 2023-09-28 DIAGNOSIS — Z79899 Other long term (current) drug therapy: Secondary | ICD-10-CM | POA: Diagnosis not present

## 2023-09-28 DIAGNOSIS — D72829 Elevated white blood cell count, unspecified: Secondary | ICD-10-CM | POA: Diagnosis not present

## 2023-09-28 DIAGNOSIS — F03B18 Unspecified dementia, moderate, with other behavioral disturbance: Secondary | ICD-10-CM | POA: Diagnosis present

## 2023-09-28 DIAGNOSIS — R1900 Intra-abdominal and pelvic swelling, mass and lump, unspecified site: Secondary | ICD-10-CM | POA: Diagnosis present

## 2023-09-28 DIAGNOSIS — E860 Dehydration: Secondary | ICD-10-CM | POA: Diagnosis present

## 2023-09-28 DIAGNOSIS — C578 Malignant neoplasm of overlapping sites of female genital organs: Secondary | ICD-10-CM | POA: Diagnosis present

## 2023-09-28 DIAGNOSIS — R971 Elevated cancer antigen 125 [CA 125]: Secondary | ICD-10-CM | POA: Diagnosis present

## 2023-09-28 DIAGNOSIS — F0390 Unspecified dementia without behavioral disturbance: Secondary | ICD-10-CM | POA: Diagnosis present

## 2023-09-28 DIAGNOSIS — K760 Fatty (change of) liver, not elsewhere classified: Secondary | ICD-10-CM | POA: Diagnosis present

## 2023-09-28 DIAGNOSIS — K7031 Alcoholic cirrhosis of liver with ascites: Secondary | ICD-10-CM | POA: Diagnosis not present

## 2023-09-28 DIAGNOSIS — Z8249 Family history of ischemic heart disease and other diseases of the circulatory system: Secondary | ICD-10-CM | POA: Diagnosis not present

## 2023-09-28 DIAGNOSIS — K8020 Calculus of gallbladder without cholecystitis without obstruction: Secondary | ICD-10-CM | POA: Diagnosis present

## 2023-09-28 DIAGNOSIS — I10 Essential (primary) hypertension: Secondary | ICD-10-CM | POA: Diagnosis present

## 2023-09-28 DIAGNOSIS — R109 Unspecified abdominal pain: Secondary | ICD-10-CM | POA: Diagnosis present

## 2023-09-28 DIAGNOSIS — N390 Urinary tract infection, site not specified: Secondary | ICD-10-CM | POA: Diagnosis present

## 2023-09-28 DIAGNOSIS — I959 Hypotension, unspecified: Secondary | ICD-10-CM | POA: Diagnosis not present

## 2023-09-28 DIAGNOSIS — R031 Nonspecific low blood-pressure reading: Secondary | ICD-10-CM | POA: Diagnosis present

## 2023-09-28 DIAGNOSIS — Z66 Do not resuscitate: Secondary | ICD-10-CM | POA: Diagnosis present

## 2023-09-28 DIAGNOSIS — R180 Malignant ascites: Secondary | ICD-10-CM | POA: Diagnosis present

## 2023-09-28 DIAGNOSIS — Z7722 Contact with and (suspected) exposure to environmental tobacco smoke (acute) (chronic): Secondary | ICD-10-CM | POA: Diagnosis present

## 2023-09-28 DIAGNOSIS — N3000 Acute cystitis without hematuria: Secondary | ICD-10-CM | POA: Diagnosis not present

## 2023-09-28 DIAGNOSIS — Z822 Family history of deafness and hearing loss: Secondary | ICD-10-CM | POA: Diagnosis not present

## 2023-09-28 LAB — LACTATE DEHYDROGENASE, PLEURAL OR PERITONEAL FLUID: LD, Fluid: 974 U/L — ABNORMAL HIGH (ref 3–23)

## 2023-09-28 LAB — GLUCOSE, PLEURAL OR PERITONEAL FLUID: Glucose, Fluid: 60 mg/dL

## 2023-09-28 LAB — BODY FLUID CELL COUNT WITH DIFFERENTIAL
Eos, Fluid: 0 %
Lymphs, Fluid: 4 %
Monocyte-Macrophage-Serous Fluid: 39 % — ABNORMAL LOW (ref 50–90)
Neutrophil Count, Fluid: 57 % — ABNORMAL HIGH (ref 0–25)
Total Nucleated Cell Count, Fluid: >10000 uL — ABNORMAL HIGH (ref 0–1000)

## 2023-09-28 LAB — PROTEIN, PLEURAL OR PERITONEAL FLUID: Total protein, fluid: 4.4 g/dL

## 2023-09-28 LAB — ALBUMIN, PLEURAL OR PERITONEAL FLUID: Albumin, Fluid: 2.1 g/dL

## 2023-09-28 LAB — C-REACTIVE PROTEIN: CRP: 20.9 mg/dL — ABNORMAL HIGH (ref <1.0)

## 2023-09-28 LAB — SEDIMENTATION RATE: Sed Rate: 40 mm/h — ABNORMAL HIGH (ref 0–22)

## 2023-09-28 LAB — LACTATE DEHYDROGENASE: LDH: 174 U/L (ref 98–192)

## 2023-09-28 MED ORDER — MEMANTINE HCL 10 MG PO TABS
10.0000 mg | ORAL_TABLET | Freq: Two times a day (BID) | ORAL | Status: DC
  Administered 2023-09-28 – 2023-10-01 (×7): 10 mg via ORAL
  Filled 2023-09-28 (×8): qty 1

## 2023-09-28 MED ORDER — ONDANSETRON HCL 4 MG PO TABS: 4.0000 mg | ORAL_TABLET | Freq: Four times a day (QID) | ORAL | Status: DC | PRN

## 2023-09-28 MED ORDER — SODIUM CHLORIDE 0.9 % IV SOLN
1.0000 g | INTRAVENOUS | Status: DC
  Administered 2023-09-28 – 2023-09-29 (×2): 1 g via INTRAVENOUS
  Filled 2023-09-28 (×2): qty 10

## 2023-09-28 MED ORDER — SODIUM CHLORIDE 0.9% FLUSH
3.0000 mL | Freq: Two times a day (BID) | INTRAVENOUS | Status: DC
  Administered 2023-09-28 – 2023-10-01 (×6): 3 mL via INTRAVENOUS

## 2023-09-28 MED ORDER — ACETAMINOPHEN 650 MG RE SUPP: 650.0000 mg | Freq: Four times a day (QID) | RECTAL | Status: DC | PRN

## 2023-09-28 MED ORDER — ALBUTEROL SULFATE (2.5 MG/3ML) 0.083% IN NEBU: 2.5000 mg | INHALATION_SOLUTION | Freq: Four times a day (QID) | RESPIRATORY_TRACT | Status: DC | PRN

## 2023-09-28 MED ORDER — LIDOCAINE HCL (PF) 1 % IJ SOLN
INTRAMUSCULAR | Status: AC
  Filled 2023-09-28: qty 30

## 2023-09-28 MED ORDER — LIDOCAINE HCL (PF) 1 % IJ SOLN
8.0000 mL | Freq: Once | INTRAMUSCULAR | Status: AC
  Administered 2023-09-28: 8 mL via INTRADERMAL

## 2023-09-28 MED ORDER — ONDANSETRON HCL 4 MG/2ML IJ SOLN: 4.0000 mg | Freq: Four times a day (QID) | INTRAMUSCULAR | Status: DC | PRN

## 2023-09-28 MED ORDER — SODIUM CHLORIDE 0.9 % IV SOLN: INTRAVENOUS | Status: AC

## 2023-09-28 MED ORDER — ENOXAPARIN SODIUM 30 MG/0.3ML IJ SOSY
30.0000 mg | PREFILLED_SYRINGE | INTRAMUSCULAR | Status: DC
  Administered 2023-09-28 – 2023-10-01 (×4): 30 mg via SUBCUTANEOUS
  Filled 2023-09-28 (×4): qty 0.3

## 2023-09-28 MED ORDER — ACETAMINOPHEN 325 MG PO TABS
650.0000 mg | ORAL_TABLET | Freq: Four times a day (QID) | ORAL | Status: DC | PRN
  Administered 2023-09-29 – 2023-09-30 (×3): 650 mg via ORAL
  Filled 2023-09-28 (×3): qty 2

## 2023-09-28 NOTE — ED Notes (Signed)
Report given to 6 N RN. All questions answered 

## 2023-09-28 NOTE — Progress Notes (Addendum)
 Plan of Care Note for accepted transfer   Patient: Kelly [name] MRN: [id number]   DOA: 09/27/2023  Facility requesting transfer: [facility] Requesting Provider: Theophilus Pagan, DO Reason for transfer: Abdominal pain, possible sepsis due to UTI and pelvic mass with ascites concerning for uterine or ovarian tumor. Facility course: Kelly [name] is a 89 y.o. female with PMHx dementia and HTN presenting with a IVC cute onset abdominal pain that began last night, described as generalized with patient clutching her lower abdomen.  Reports she had difficulty sleeping then eventually went to sleep and had a BM this morning reportedly improved her pain.  Reports she went out this morning and when driving [place] railroad tracks the jostling pain caused severe pain and she was yelling out.  Reports her pain has mostly resolved since that time.  Independent with feeding herself but has full-time caregiver.  Wears depends, no history of recurrent UTI.  No other prior GI history.  Denies fever.  Denies sick contacts.  Denies N/V/D.  Denies chest pain or shortness of breath.  Patient reports she is mostly asymptomatic at rest during exam.  The patient is DNR.   Patient with limited participation in examination, history primarily provided by daughter.  Upon presentation to the emergency room, temperature was 99.7 with blood pressure 106/54 with otherwise normal vital signs.  Labs revealed unremarkable CMP.  CBC showed leukocytosis of 22.  Lactic acid was 1.4. UA was suspicious for UTI.SABRA  Abdominal CT scan with contrast revealed the following: 1. Diffusely abnormal appearance of the uterus, contiguous with a left pelvic/adnexal dominant mass. Findings are favored to represent either cervical/uterine primary with extension into the left adnexa or synchronous cervical/uterine and ovarian primaries. Recommend gynecological consultation. 2. Small volume abdominopelvic ascites, without evidence of peritoneal  metastasis. 3. No bowel obstruction or other acute complication. 4. Incidental findings, including: Moderate hiatal hernia. Cholelithiasis. Pelvic floor laxity.    The patient was given 1 g of IV Rocephin  and 650 mg p.o. Tylenol .  Contact was made with Dr. Reeta with GYN that recommended IR paracentesis and follow-up on outpatient basis.  I recommended adding IV Flagyl .  Plan of care: The patient is accepted for admission to Telemetry unit, at [facility]..  The patient will be under the care and responsibility of the EDP until arrival to [facility].  Author: Madison DELENA Peaches, MD 09/28/2023  Check [email or web address] for on-call coverage.  Nursing staff, Please call [facility] Admits & Consults System-Wide number on Amion as soon as patient's arrival, so appropriate admitting provider can evaluate the pt.

## 2023-09-28 NOTE — Procedures (Signed)
 PROCEDURE SUMMARY:  Successful image-guided paracentesis from the right lower abdomen.  Yielded 250 mL of hazy light brown fluid.  No immediate complications.  EBL = trace. Patient tolerated well.   Specimen was sent for labs.  Please see imaging section of Epic for full dictation.   Mianna Iezzi H Lalia Loudon PA-C 09/28/2023 12:52 PM

## 2023-09-28 NOTE — H&P (Addendum)
 History and Physical    Patient: Kelly [name] FMW:[id number] DOB: Jul 08, 1934 DOA: 09/27/2023 DOS: the patient was seen and examined on 09/28/2023 PCP: Debrah Josette ORN., PA-C  Patient coming from: Transfer from [facility]  Chief Complaint:  Chief Complaint  Patient presents with   Abdominal Pain   HPI: Kelly [name] is a 89 y.o. female with medical history significant of dementia and HTN presenting with presents with abdominal pain. She is accompanied by her daughter.  She experienced severe lower abdominal pain on Thursday night 2 days ago around 10:45 PM, which was so intense that she was unable to find a comfortable position, alternating between lying down and sitting up. Her daughter was notified by the healthcare workers present at the time. She was given Tylenol , which helped her sleep well that night.  The following morning, she reported mild stomach pain and had a bowel movement. She went out with her healthcare worker and was reportedly fine until she drove over a railroad track, which caused a sudden onset of pain, prompting her daughter to take her to the hospital for further evaluation.  She has significant short-term memory issues, characterized by frequent repetition and a lack of awareness of her surroundings. She lives at home and is assisted by healthcare workers during the day. No current pain and is described as calm.    In the emergency room patient was noted to be afebrile with blood pressure 106/54.  All other vital signs maintained.  Labs significant for leukocytosis of 22 and lactic acid 1.4.  Urinalysis suspicious for urinary tract infection.  Abdominal CT with contrast revealed diffusely abnormal appearance of the uterus, contiguous with a left pelvic/adnexal dominant mass. Findings are favored to represent either cervical/uterine primary with extension into the left adnexa or synchronous cervical/uterine and ovarian primaries.  Patient had been given Rocephin  1 g IV,  metronidazole  500 mg IV, and 650 mg of Tylenol  p.o.  Contact was made with Dr. Reeta with GYN that recommended IR paracentesis and follow-up on outpatient basis.    Review of Systems: As mentioned in the history of present illness. All other systems reviewed and are negative. Past Medical History:  Diagnosis Date   Hypertension    Memory loss    Past Surgical History:  Procedure Laterality Date   INTRAMEDULLARY (IM) NAIL INTERTROCHANTERIC Left 09/18/2016   Procedure: INTRAMEDULLARY (IM) NAIL INTERTROCHANTRIC;  Surgeon: Fidel Rogue, MD;  Location: [facility];  Service: Orthopedics;  Laterality: Left;   Social History:  reports that she is a non-smoker but has been exposed to tobacco smoke. She has never used smokeless tobacco. She reports that she does not drink alcohol  and does not use drugs.  No Known Allergies  Family History  Problem Relation Age of Onset   Hypertension Mother    Heart failure Mother    Hearing loss Father    Aneurysm Father        brain   Breast cancer Neg Hx     Prior to Admission medications   Medication Sig Start Date End Date Taking? Authorizing Provider  Ascorbic Acid  (VITAMIN C ) 100 MG tablet Take 100 mg by mouth daily.     [provider]  Calcium Carbonate Antacid (CALCIUM CARBONATE PO) Take by mouth. Dose unknown    [provider]  Cholecalciferol  (VITAMIN D-1000 MAX ST) 1000 units tablet Take by mouth daily.     [provider]  losartan (COZAAR) 25 MG tablet Take 25 mg by mouth daily. 08/13/17  [provider]  memantine  (NAMENDA ) 10 MG tablet Take 1 tablet (10 mg total) by mouth 2 (two) times daily. 01/25/20   Penumalli, Vikram R, MD  Multiple Minerals-Vitamins (CALCIUM-MAGNESIUM-ZINC-D3 PO) Take by mouth daily.    [provider]    Physical Exam: Vitals:   09/28/23 0530 09/28/23 0627 09/28/23 0700 09/28/23 0702  BP: 108/62 109/62 109/62 109/62  Pulse: 66 63 63 63  Resp: 15 16 16 16   Temp:  98  F (36.7 C) 98 F (36.7 C) 98 F (36.7 C)  TempSrc:  Oral Oral Oral  SpO2: 94% 95%    Weight:    68 kg  Height:   5' 4 (1.626 m) 4' (1.219 m)    Constitutional: Elderly female who appears to be in no acute distress at this time Eyes: PERRL, lids and conjunctivae normal ENMT: Mucous membranes are moist.  Normal dentition.  Neck: normal, supple  Respiratory: clear to auscultation bilaterally, no wheezing, no crackles. Normal respiratory effort. No accessory muscle use.  Cardiovascular: Regular rate and rhythm, no murmurs / rubs / gallops. No extremity edema. 2+ pedal pulses. No carotid bruits.  Abdomen: Mass thought to be palpable suprapubically.  No significant tenderness palpation at this time.  Bowel sounds present all 4 quadrants Musculoskeletal: no clubbing / cyanosis. No joint deformity upper and lower extremities. Good ROM, no contractures. Normal muscle tone.  Skin: no rashes, lesions, ulcers. No induration Neurologic: CN 2-12 grossly intact. Strength 5/5 in all 4.  Psychiatric:  . Alert and oriented x to person and place. Normal mood.   Data Reviewed:   EKG reveals sinus rhythm 81 bpm with a right bundle branch block.  Reviewed labs,  imaging, and pertinent records as documented.  Assessment and Plan:  Urinary tract infection Present on admission.  Patient noted to have fever up to 100.5 F and complaints of abdominal pain.  Urinalysis noted moderate hemoglobin, small leukocytes, greater than 1.046 specific gravity, rare bacteria, 21-50 RBC/hpf, and 11-20 WBCs.  Patient had been given empiric antibiotics of Rocephin  IV. - Admit to a medical telemetry bed - Check urine culture - Continue empiric antibiotics of Rocephin  - Tylenol  as needed for fever/ mild pain  Pelvic mass Acute.   CT scan of the abdomen pelvis noted diffusely abnormal appearance of the uterus, contiguous with a left pelvic/adnexal dominant mass. Findings are favored to represent either cervical/uterine  primary with extension into the left adnexa or synchronous cervical/uterine and ovarian primaries. - Check ESR(40) and CRP(20.1) - Check LDH and CA125 - IR consulted for paracentesis with lab orders for cell count and differential, total protein and albumin, cytology, LDH, and glucose placed - Will need referral to gynecology oncology in the outpatient setting  Leukocytosis Acute.  WBC elevated at 22.  Lactic acid was reassuring at 1.4.  Suspect leukocytosis secondary to above.  Patient did not appear to meet sepsis criteria. - Recheck CBC tomorrow morning  Transient hypotension On admission blood pressures noted to be as low as 92/50, but improved without intervention.  Suspect patient to be only slightly dehydrated given the elevated specific gravity noted on urinalysis. - Hold home blood pressure medication of losartan - Goal MAP greater than 65 - IV fluids at 75 mL/h for 1 L  Dementia Patient has poor short-term memory. - Delirium precautions - Continue Namenda   DVT prophylaxis: Lovenox  Advance Care Planning:   Code Status: Do not attempt resuscitation (DNR) PRE-ARREST INTERVENTIONS DESIRED   Consults: Telephone conversation OB/GYN with ED  provider  Family Communication: Daughter updated at bedside  Severity of Illness: The appropriate patient status for this patient is INPATIENT. Inpatient status is judged to be reasonable and necessary in order to provide the required intensity of service to ensure the patient's safety. The patient's presenting symptoms, physical exam findings, and initial radiographic and laboratory data in the context of their chronic comorbidities is felt to place them at high risk for further clinical deterioration. Furthermore, it is not anticipated that the patient will be medically stable for discharge from the hospital within 2 midnights of admission.   * I certify that at the point of admission it is my clinical judgment that the patient will require  inpatient hospital care spanning beyond 2 midnights from the point of admission due to high intensity of service, high risk for further deterioration and high frequency of surveillance required.*  Author: Maximino DELENA Sharps, MD 09/28/2023 7:18 AM  For on call review [email or web address].

## 2023-09-28 NOTE — ED Notes (Signed)
 Patient transported to [facility] via [facility] at this time

## 2023-09-29 DIAGNOSIS — N3000 Acute cystitis without hematuria: Secondary | ICD-10-CM

## 2023-09-29 LAB — URINE CULTURE: Culture: NO GROWTH

## 2023-09-29 LAB — CBC
HCT: 36.5 % (ref 36.0–46.0)
Hemoglobin: 11.9 g/dL — ABNORMAL LOW (ref 12.0–15.0)
MCH: 32.1 pg (ref 26.0–34.0)
MCHC: 32.6 g/dL (ref 30.0–36.0)
MCV: 98.4 fL (ref 80.0–100.0)
Platelets: 271 10*3/uL (ref 150–400)
RBC: 3.71 MIL/uL — ABNORMAL LOW (ref 3.87–5.11)
RDW: 12.6 % (ref 11.5–15.5)
WBC: 11.4 10*3/uL — ABNORMAL HIGH (ref 4.0–10.5)
nRBC: 0 % (ref 0.0–0.2)

## 2023-09-29 LAB — BASIC METABOLIC PANEL WITH GFR
Anion gap: 6 (ref 5–15)
BUN: 13 mg/dL (ref 8–23)
CO2: 19 mmol/L — ABNORMAL LOW (ref 22–32)
Calcium: 8.1 mg/dL — ABNORMAL LOW (ref 8.9–10.3)
Chloride: 113 mmol/L — ABNORMAL HIGH (ref 98–111)
Creatinine, Ser: 0.92 mg/dL (ref 0.44–1.00)
GFR, Estimated: >60 mL/min (ref >60)
Glucose, Bld: 82 mg/dL (ref 70–99)
Potassium: 3.8 mmol/L (ref 3.5–5.1)
Sodium: 138 mmol/L (ref 135–145)

## 2023-09-29 NOTE — Plan of Care (Signed)
°  Problem: Safety: Goal: Ability to remain free from injury will improve Outcome: Progressing   Problem: Coping: Goal: Level of anxiety will decrease Outcome: Progressing   Problem: Nutrition: Goal: Adequate nutrition will be maintained Outcome: Progressing   Problem: Activity: Goal: Risk for activity intolerance will decrease Outcome: Progressing

## 2023-09-29 NOTE — Plan of Care (Signed)

## 2023-09-29 NOTE — Evaluation (Signed)
 Physical Therapy Evaluation Patient Details Name: Kelly [name] MRN: [id number] DOB: [birth year] Today's Date: 09/29/2023  History of Present Illness  Admitted with Abdominal pain, possible sepsis due to UTI and pelvic mass with ascites concerning for uterine or ovarian tumor; PMHx dementia and HTN  Clinical Impression  Pt admitted with above diagnosis. Lives at home with caregivers during waking hours (and family working to arrange for overnight assist), in a 2-level home (can stay on main floor) with 3 steps to enter; Prior to admission, pt was able to manage her basic ADLs without assist, and typically walks with a cane; Presents to PT with generalized weakness, balance deficits, relatively recent fall; Participating well and very pleasant;  Pt currently with functional limitations due to the deficits listed below (see PT Problem List). Pt will benefit from skilled PT to increase their independence and safety with mobility to allow discharge to the venue listed below.       Worth considering Outpt PT for gait and balance work, as well as added social benefit; Did not have the opportunity to discuss this with pt and her daughter yet      If plan is discharge home, recommend the following: A little help with walking and/or transfers;A little help with bathing/dressing/bathroom;Assistance with cooking/housework;Assist for transportation;Help with stairs or ramp for entrance;Supervision due to cognitive status   Can travel by private vehicle        Equipment Recommendations Rolling walker (2 wheels);BSC/3in1 (She may already have these)  Recommendations for Other Services  Other (comment) (Mobility team)    Functional Status Assessment Patient has had a recent decline in their functional status and demonstrates the ability to make significant improvements in function in a reasonable and predictable amount of time.     Precautions / Restrictions Precautions Precautions:  Fall Restrictions Weight Bearing Restrictions Per Provider Order: No      Mobility  Bed Mobility Overal bed mobility: Needs Assistance Bed Mobility: Supine to Sit     Supine to sit: Mod assist     General bed mobility comments: light mod handheld assist to pull to sit    Transfers Overall transfer level: Needs assistance Equipment used: Straight cane Transfers: Sit to/from Stand Sit to Stand: Min assist           General transfer comment: Slow rise, and needed 2 tries to get up; min assist to steady during power up    Ambulation/Gait Ambulation/Gait assistance: Contact guard assist Gait Distance (Feet): 150 Feet Assistive device: Straight cane Gait Pattern/deviations: Step-through pattern       General Gait Details: Slow pace, but no overt losses of balance  Stairs Stairs: Yes Stairs assistance: Contact guard assist Stair Management: One rail Left, Step to pattern, Forwards, With cane Number of Stairs: 4 General stair comments: Overall steady, and naturally leads with RLE going up and LLE going down  Wheelchair Mobility     Tilt Bed    Modified Rankin (Stroke Patients Only)       Balance Overall balance assessment: Mild deficits observed, not formally tested, History of Falls (Daughter reports took a fall within the past 3 months, when getting up during the night)                                           Pertinent Vitals/Pain Pain Assessment Pain Assessment: No/denies pain    Home Living Family/patient  expects to be discharged to:: Private residence Living Arrangements: Alone Available Help at Discharge: Personal care attendant (Solid help during waking hours; family has been working on overnight assist, and have arranged to 2-3 nights of private duty caregivers; working on 7 nights) Type of Home: House Home Access: Stairs to enter Entrance Stairs-Rails: Left Entrance Stairs-Number of Steps: 3   Home Layout: Two level;Able  to live on main level with bedroom/bathroom Home Equipment: Cane - single point (I believe she has RW and possibly 3in1)      Prior Function Prior Level of Function : Needs assist             Mobility Comments: Supervision for safety, but typically does not need physical assist ADLs Comments: Supervision for safety, but typically does not need physical assist     Extremity/Trunk Assessment   Upper Extremity Assessment Upper Extremity Assessment: Overall WFL for tasks assessed (for simple tasks)    Lower Extremity Assessment Lower Extremity Assessment: Generalized weakness    Cervical / Trunk Assessment Cervical / Trunk Assessment: Normal  Communication   Communication Communication: Impaired Factors Affecting Communication: Hearing impaired    Cognition Arousal: Alert Behavior During Therapy: WFL for tasks assessed/performed   PT - Cognitive impairments: History of cognitive impairments                       PT - Cognition Comments: short-term memory deficits with dementia Following commands: Intact       Cueing Cueing Techniques: Verbal cues     General Comments General comments (skin integrity, edema, etc.): Daughter present and helpful with answering questions re: home setup; She states family will stay overnight with pt initially    Exercises     Assessment/Plan    PT Assessment Patient needs continued PT services  PT Problem List Decreased activity tolerance;Decreased balance;Decreased coordination;Decreased cognition;Decreased knowledge of use of DME;Decreased safety awareness;Decreased knowledge of precautions       PT Treatment Interventions DME instruction;Gait training;Stair training;Functional mobility training;Therapeutic activities;Therapeutic exercise;Balance training;Neuromuscular re-education;Cognitive remediation;Patient/family education;Manual techniques    PT Goals (Current goals can be found in the Care Plan section)  Acute  Rehab PT Goals Patient Stated Goal: Hopes to be home soon PT Goal Formulation: With patient/family Time For Goal Achievement: 10/13/23 Potential to Achieve Goals: Good    Frequency Min 2X/week     Co-evaluation               AM-PAC PT 6 Clicks Mobility  Outcome Measure Help needed turning from your back to your side while in a flat bed without using bedrails?: A Little Help needed moving from lying on your back to sitting on the side of a flat bed without using bedrails?: A Little Help needed moving to and from a bed to a chair (including a wheelchair)?: A Little Help needed standing up from a chair using your arms (e.g., wheelchair or bedside chair)?: A Little Help needed to walk in hospital room?: A Little Help needed climbing 3-5 steps with a railing? : A Little 6 Click Score: 18    End of Session   Activity Tolerance: Patient tolerated treatment well Patient left: in chair;with call bell/phone within reach;with chair alarm set (eating breakfast) Nurse Communication: Mobility status PT Visit Diagnosis: Unsteadiness on feet (R26.81)    Time: 9082-9059 PT Time Calculation (min) (ACUTE ONLY): 23 min   Charges:   PT Evaluation $PT Eval Low Complexity: 1 Low PT Treatments $Gait Training: 8-22 mins  PT General Charges $$ ACUTE PT VISIT: 1 Visit         Silvano Currier, PT  Acute Rehabilitation Services Office [phone] Secure Chat welcomed   Silvano VEAR Currier 09/29/2023, 10:35 AM

## 2023-09-29 NOTE — Progress Notes (Signed)
 PROGRESS NOTE    Kelly [name]  FMW:[id number] DOB: [birth year] DOA: 09/27/2023 PCP: Debrah Josette ORN., PA-C   Brief Narrative:  This 89 yrs old female with medical history significant of dementia and HTN presenting in the ED with abdominal pain. She has significant short-term memory issues, characterized by frequent repetition and a lack of awareness of her surroundings. She lives at home and is assisted by healthcare workers during the day. In ED  Labs significant for leukocytosis of 22 and lactic acid 1.4.  Urinalysis suspicious for urinary tract infection.  Abdominal CT with contrast revealed diffusely abnormal appearance of the uterus, contiguous with a left pelvic/adnexal dominant mass. Findings are favored to represent either cervical/uterine primary with extension into the left adnexa or synchronous cervical/uterine and ovarian primaries.  Patient was admitted for further evaluation and started on empiric antibiotic Rocephin  and metronidazole .  OB/GYN was consulted.  Assessment & Plan:   Active Problems:   UTI (urinary tract infection)   Pelvic mass   Leukocytosis   Transient hypotension   Dementia without behavioral disturbance (HCC)  Urinary tract infection: POA Patient noted to have fever up to 100.5 F and complaints of abdominal pain.   Urinalysis noted moderate hemoglobin, small leukocytes, 21-50 RBC/hpf, and 11-20 WBCs.  Initiated on empiric antibiotic IV Rocephin . Check urine culture Tylenol  as needed for fever/ mild pain   Pelvic mass: CT A/P noted diffusely abnormal appearance of the uterus, contiguous with a left pelvic/adnexal dominant mass. Findings are favored to represent either cervical/uterine primary with extension into the left adnexa or synchronous cervical/uterine and ovarian primaries. Check ESR(40) and CRP(20.1) Check LDH and CA125 IR consulted for paracentesis with lab orders for cell count and differential, total protein and albumin, cytology, LDH, and  glucose placed Will need referral to gynecology oncology in the outpatient setting   Leukocytosis: WBC elevated at 22.  Lactic acid was reassuring at 1.4.   Suspect leukocytosis secondary to above.   Patient did not appear to meet sepsis criteria. Recheck CBC tomorrow morning   Transient hypotension: On admission blood pressures noted to be as low as 92/50, but improved without intervention.  Suspect patient to be only slightly dehydrated given the elevated specific gravity noted on urinalysis. Hold home blood pressure medication of losartan Goal MAP greater than 65 Continue IV fluids at 75 mL/h for 1 L   Dementia: Patient has poor short-term memory. Delirium precautions Continue Namenda .   DVT prophylaxis: Lovenox  Code Status: DNR Family Communication: Daughter at bed side Disposition Plan:    Status is: Inpatient Remains inpatient appropriate because: Admitted for UTI, abdominal distention with ascites.     Consultants:  OB/GYN  Procedures: Paracentesis  Antimicrobials:  Anti-infectives (From admission, onward)    Start     Dose/Rate Route Frequency Ordered Stop   09/28/23 2200  cefTRIAXone  (ROCEPHIN ) 1 g in sodium chloride  0.9 % 100 mL IVPB        1 g 200 mL/hr over 30 Minutes Intravenous Every 24 hours 09/28/23 0844     09/27/23 2315  cefTRIAXone  (ROCEPHIN ) 1 g in sodium chloride  0.9 % 100 mL IVPB        1 g 200 mL/hr over 30 Minutes Intravenous  Once 09/27/23 2304 09/28/23 0121   09/27/23 2315  metroNIDAZOLE  (FLAGYL ) IVPB 500 mg        500 mg 100 mL/hr over 60 Minutes Intravenous  Once 09/27/23 2304 09/28/23 0239        Subjective: Patient was seen and examined  at bedside.  Overnight events noted. Patient was sitting comfortably on the chair,  alongside with her daughter.  She still reports having burning urination.  Objective: Vitals:   09/28/23 1545 09/28/23 1953 09/29/23 0626 09/29/23 0752  BP: 116/61 117/69 (!) 110/56 (!) 114/58  Pulse: 76 74 63  69  Resp: 16 16 16 17   Temp: 98.7 F (37.1 C) 98.7 F (37.1 C) 97.8 F (36.6 C) 98.2 F (36.8 C)  TempSrc: Oral  Oral Oral  SpO2: 97% 97% 92% 93%  Weight:      Height:        Intake/Output Summary (Last 24 hours) at 09/29/2023 1428 Last data filed at 09/29/2023 1031 Gross per 24 hour  Intake 391.56 ml  Output --  Net 391.56 ml   Filed Weights   09/28/23 0702  Weight: 68 kg    Examination:  General exam: Appears calm and comfortable, not in any acute distress. Respiratory system: Clear to auscultation. Respiratory effort normal.  RR 15 Cardiovascular system: S1 & S2 heard, RRR. No JVD, murmurs, rubs, gallops or clicks. Gastrointestinal system: Abdomen is non distended, soft and non tender. No organomegaly or masses felt. Normal bowel sounds heard. Central nervous system: Alert and oriented x 3. No focal neurological deficits. Extremities: No edema, no cyanosis, no clubbing. Skin: No rashes, lesions or ulcers Psychiatry: Judgement and insight appear normal. Mood & affect appropriate.     Data Reviewed: I have personally reviewed following labs and imaging studies  CBC: Recent Labs  Lab 09/27/23 1545 09/29/23 0558  WBC 22.0* 11.4*  HGB 14.0 11.9*  HCT 41.5 36.5  MCV 96.3 98.4  PLT 325 271   Basic Metabolic Panel: Recent Labs  Lab 09/27/23 1545 09/29/23 0558  NA 139 138  K 4.3 3.8  CL 106 113*  CO2 21* 19*  GLUCOSE 116* 82  BUN 16 13  CREATININE 0.94 0.92  CALCIUM 9.4 8.1*   GFR: Estimated Creatinine Clearance: 25.8 mL/min (by C-G formula based on SCr of 0.92 mg/dL). Liver Function Tests: Recent Labs  Lab 09/27/23 1545  AST 31  ALT 10  ALKPHOS 89  BILITOT 0.7  PROT 7.4  ALBUMIN 3.7   Recent Labs  Lab 09/27/23 1545  LIPASE 18   No results for input(s): AMMONIA in the last 168 hours. Coagulation Profile: No results for input(s): INR, PROTIME in the last 168 hours. Cardiac Enzymes: No results for input(s): CKTOTAL, CKMB,  CKMBINDEX, TROPONINI in the last 168 hours. BNP (last 3 results) No results for input(s): PROBNP in the last 8760 hours. HbA1C: No results for input(s): HGBA1C in the last 72 hours. CBG: No results for input(s): GLUCAP in the last 168 hours. Lipid Profile: No results for input(s): CHOL, HDL, LDLCALC, TRIG, CHOLHDL, LDLDIRECT in the last 72 hours. Thyroid Function Tests: No results for input(s): TSH, T4TOTAL, FREET4, T3FREE, THYROIDAB in the last 72 hours. Anemia Panel: No results for input(s): VITAMINB12, FOLATE, FERRITIN, TIBC, IRON, RETICCTPCT in the last 72 hours. Sepsis Labs: Recent Labs  Lab 09/27/23 1626  LATICACIDVEN 1.4    No results found for this or any previous visit (from the past 240 hours).   Radiology Studies: US  Paracentesis Result Date: 09/28/2023 INDICATION: 89 year old female with recent finding of pelvic mass and ascites. Request for therapeutic and diagnostic paracentesis. EXAM: ULTRASOUND GUIDED  PARACENTESIS MEDICATIONS: 10 mL 1% lidocaine  COMPLICATIONS: None immediate. PROCEDURE: Informed written consent was obtained from the patient and her daughter after a discussion of the risks, benefits and  alternatives to treatment. A timeout was performed prior to the initiation of the procedure. Initial ultrasound scanning demonstrates a small amount of ascites within the right lower abdominal quadrant. The right lower abdomen was prepped and draped in the usual sterile fashion. 1% lidocaine  was used for local anesthesia. Following this, a 19 gauge, 7-cm, Yueh catheter was introduced. An ultrasound image was saved for documentation purposes. The paracentesis was performed. The catheter was removed and a dressing was applied. The patient tolerated the procedure well without immediate post procedural complication. FINDINGS: A total of approximately 250 mL of hazy, light brown colored fluid was removed. Samples were sent to the  laboratory as requested by the clinical team. IMPRESSION: Successful ultrasound-guided paracentesis yielding 250 mL of peritoneal fluid. Performed by: Aimee Han, PA-C Electronically Signed   By: CHRISTELLA.  Shick M.D.   On: 09/28/2023 15:31   US  Abdomen Limited RUQ (LIVER/GB) Result Date: 09/27/2023 CLINICAL DATA:  151471 RUQ pain 151471 EXAM: ULTRASOUND ABDOMEN LIMITED RIGHT UPPER QUADRANT COMPARISON:  None Available. FINDINGS: Gallbladder: Several shadowing gallstones. No pericholecystic fluid or wall thickening. Common bile duct: Diameter: 0.2 cm. Liver: Liver is hyperechoic consistent with fatty infiltration. No focal hepatic lesions identified. No intrahepatic ductal dilatation. Hepatopetal portal vein flow. Trace perihepatic ascites. IMPRESSION: 1. Cholelithiasis. 2. Fatty infiltration of the liver. 3. Trace perihepatic ascites. Electronically Signed   By: Fonda Field M.D.   On: 09/27/2023 20:08   CT ABDOMEN PELVIS W CONTRAST Result Date: 09/27/2023 CLINICAL DATA:  Generalized abdominal pain. * Tracking Code: BO * EXAM: CT ABDOMEN AND PELVIS WITH CONTRAST TECHNIQUE: Multidetector CT imaging of the abdomen and pelvis was performed using the standard protocol following bolus administration of intravenous contrast. RADIATION DOSE REDUCTION: This exam was performed according to the departmental dose-optimization program which includes automated exposure control, adjustment of the mA and/or kV according to patient size and/or use of iterative reconstruction technique. CONTRAST:  100mL OMNIPAQUE  IOHEXOL  300 MG/ML  SOLN COMPARISON:  None Available. FINDINGS: Lower chest: Bibasilar scarring mild cardiomegaly, without pericardial or pleural effusion. Moderate hiatal hernia. Hepatobiliary: Normal liver. Multiple gallstones on the order of 6 mm without acute cholecystitis or biliary duct dilatation. Pancreas: Normal, without mass or ductal dilatation. Spleen: Normal in size, without focal abnormality. Adrenals/Urinary  Tract: Normal adrenal glands. Left renal too small to characterize lesions are most likely cysts . In the absence of clinically indicated signs/symptoms require(s) no independent follow-up. Normal right kidney. No hydronephrosis. Normal urinary bladder. Stomach/Bowel: Normal remainder of the stomach. Normal colon and terminal ileum. Appendix not visualized. Normal small bowel. Vascular/Lymphatic: Aortic atherosclerosis. No abdominopelvic adenopathy. Reproductive: The uterus has a diffusely abnormal morphology, with soft tissue fullness throughout. Example cervical enlargement at 5.5 x 5.6 cm on 73/3. This is contiguous with a macrolobulated left upper pelvic/adnexal heterogeneous mass of 10.2 x 8.8 cm on 62/3. Other: Pelvic floor laxity. Small volume abdominopelvic ascites. No well-defined omental/peritoneal metastasis. Musculoskeletal: Osteopenia. Left proximal femur fixation. Grade 1 L4-5 anterolisthesis. Mild convex left lumbar spine curvature. IMPRESSION: 1. Diffusely abnormal appearance of the uterus, contiguous with a left pelvic/adnexal dominant mass. Findings are favored to represent either cervical/uterine primary with extension into the left adnexa or synchronous cervical/uterine and ovarian primaries. Recommend gynecological consultation. 2. Small volume abdominopelvic ascites, without evidence of peritoneal metastasis. 3. No bowel obstruction or other acute complication. 4. Incidental findings, including: Moderate hiatal hernia. Cholelithiasis. Pelvic floor laxity. Electronically Signed   By: Rockey Kilts M.D.   On: 09/27/2023 18:27   Scheduled  Meds:  enoxaparin  (LOVENOX ) injection  30 mg Subcutaneous Q24H   memantine   10 mg Oral BID   sodium chloride  flush  3 mL Intravenous Q12H   Continuous Infusions:  cefTRIAXone  (ROCEPHIN )  IV Stopped (09/29/23 0114)     LOS: 1 day    Time spent: 50 mins    Darcel Dawley, MD [facility]   If 7PM-7AM, please contact night-coverage

## 2023-09-30 DIAGNOSIS — K7031 Alcoholic cirrhosis of liver with ascites: Secondary | ICD-10-CM

## 2023-09-30 LAB — BASIC METABOLIC PANEL WITH GFR
Anion gap: 9 (ref 5–15)
BUN: 9 mg/dL (ref 8–23)
CO2: 22 mmol/L (ref 22–32)
Calcium: 8.6 mg/dL — ABNORMAL LOW (ref 8.9–10.3)
Chloride: 109 mmol/L (ref 98–111)
Creatinine, Ser: 0.87 mg/dL (ref 0.44–1.00)
GFR, Estimated: >60 mL/min (ref >60)
Glucose, Bld: 87 mg/dL (ref 70–99)
Potassium: 3.6 mmol/L (ref 3.5–5.1)
Sodium: 140 mmol/L (ref 135–145)

## 2023-09-30 LAB — CBC
HCT: 36.4 % (ref 36.0–46.0)
Hemoglobin: 11.8 g/dL — ABNORMAL LOW (ref 12.0–15.0)
MCH: 31.4 pg (ref 26.0–34.0)
MCHC: 32.4 g/dL (ref 30.0–36.0)
MCV: 96.8 fL (ref 80.0–100.0)
Platelets: 295 10*3/uL (ref 150–400)
RBC: 3.76 MIL/uL — ABNORMAL LOW (ref 3.87–5.11)
RDW: 12.5 % (ref 11.5–15.5)
WBC: 9.6 10*3/uL (ref 4.0–10.5)
nRBC: 0 % (ref 0.0–0.2)

## 2023-09-30 LAB — PHOSPHORUS: Phosphorus: 3.5 mg/dL (ref 2.5–4.6)

## 2023-09-30 LAB — MAGNESIUM: Magnesium: 2.1 mg/dL (ref 1.7–2.4)

## 2023-09-30 LAB — CA 125: Cancer Antigen (CA) 125: 969 U/mL — ABNORMAL HIGH (ref 0.0–38.1)

## 2023-09-30 MED ORDER — SODIUM CHLORIDE 0.9 % IV SOLN
2.0000 g | INTRAVENOUS | Status: DC
  Administered 2023-09-30: 2 g via INTRAVENOUS
  Filled 2023-09-30: qty 20

## 2023-09-30 MED ORDER — ALPRAZOLAM 0.5 MG PO TABS
0.5000 mg | ORAL_TABLET | Freq: Two times a day (BID) | ORAL | Status: DC | PRN
  Administered 2023-09-30: 0.5 mg via ORAL
  Filled 2023-09-30: qty 1

## 2023-09-30 NOTE — Progress Notes (Signed)
 Mobility Specialist Progress Note:    09/30/23 1052  Mobility  Activity Ambulated with assistance in hallway;Ambulated with assistance to bathroom  Level of Assistance Contact guard assist, steadying assist  Assistive Device Ava;Other (Comment) (HHA)  Distance Ambulated (ft) 220 ft  Activity Response Tolerated well  Mobility Referral Yes  Mobility visit 1 Mobility  Mobility Specialist Start Time (ACUTE ONLY) 1037  Mobility Specialist Stop Time (ACUTE ONLY) 1049  Mobility Specialist Time Calculation (min) (ACUTE ONLY) 12 min   Pt received in chair agreeable to mobility. No physical assistance needed. Pt tended to veer to the R side of the hall but self corrected. Returned to room w/o fault. Pt requested to go to the BR. Returned pt to chair. Personal belongings and call light within reach. All needs met.   Lavanda Pollack Mobility Specialist  Please contact via [employer] or  Rehab Office [phone]

## 2023-09-30 NOTE — Progress Notes (Signed)
 PROGRESS NOTE    Kelly [name]  FMW:[id number] DOB: 11/07/1934 DOA: 09/27/2023 PCP: Debrah Josette ORN., PA-C   Brief Narrative:  This 89 yrs old female with medical history significant of dementia and HTN presenting in the ED with abdominal pain. She has significant short-term memory issues, characterized by frequent repetition and a lack of awareness of her surroundings. She lives at home and is assisted by healthcare workers during the day. In ED  Labs significant for leukocytosis of 22 and lactic acid 1.4.  Urinalysis suspicious for urinary tract infection.  Abdominal CT with contrast revealed diffusely abnormal appearance of the uterus, contiguous with a left pelvic/adnexal dominant mass. Findings are favored to represent either cervical/uterine primary with extension into the left adnexa or synchronous cervical/uterine and ovarian primaries.  Patient was admitted for further evaluation and started on empiric antibiotic Rocephin  and metronidazole .  OB/GYN was consulted.  Assessment & Plan:   Active Problems:   UTI (urinary tract infection)   Pelvic mass   Leukocytosis   Transient hypotension   Dementia without behavioral disturbance (HCC)  Urinary tract infection: POA Patient noted to have fever up to 100.5 F and complaints of abdominal pain.   Urinalysis noted moderate hemoglobin, small leukocytes, 21-50 RBC/hpf, and 11-20 WBCs.   Initiated on empiric antibiotics IV Rocephin . Urine culture > No growth  Tylenol  as needed for fever/ mild pain.   Pelvic mass: CT A/P noted diffusely abnormal appearance of the uterus, contiguous with a left pelvic/adnexal dominant mass. Findings are favored to represent either cervical/uterine primary with extension into the left adnexa or synchronous cervical/uterine and ovarian primaries. Check ESR(40) and CRP(20.1) Check LDH and CA125 IR consulted for paracentesis with lab orders for cell count and differential, total protein and albumin, cytology,  LDH, and glucose placed Will need referral to gynecology oncology in the outpatient setting. Ascitic fluid Labs consistent with SBP. Continue Ceftriaxone  2 g daily.    Leukocytosis: WBC elevated at 22.  Lactic acid was reassuring at 1.4.   Suspect leukocytosis secondary to above.   Patient did not appear to meet sepsis criteria. Recheck CBC tomorrow morning   Transient hypotension: On admission blood pressures noted to be as low as 92/50, but improved without intervention.   Suspect patient to be only slightly dehydrated given the elevated specific gravity noted on urinalysis. Hold home blood pressure medication of losartan Goal MAP greater than 65 Continue IV fluids at 75 mL/h for 1 L   Dementia: Patient has poor short-term memory. Delirium precautions Continue Namenda .   DVT prophylaxis: Lovenox  Code Status: DNR Family Communication: Daughter at bed side Disposition Plan:    Status is: Inpatient Remains inpatient appropriate because: Admitted for UTI, abdominal distention with ascites.     Consultants:  OB/GYN  Procedures: Paracentesis  Antimicrobials:  Anti-infectives (From admission, onward)    Start     Dose/Rate Route Frequency Ordered Stop   09/30/23 2200  cefTRIAXone  (ROCEPHIN ) 2 g in sodium chloride  0.9 % 100 mL IVPB        2 g 200 mL/hr over 30 Minutes Intravenous Every 24 hours 09/30/23 0824     09/28/23 2200  cefTRIAXone  (ROCEPHIN ) 1 g in sodium chloride  0.9 % 100 mL IVPB  Status:  Discontinued        1 g 200 mL/hr over 30 Minutes Intravenous Every 24 hours 09/28/23 0844 09/30/23 0824   09/27/23 2315  cefTRIAXone  (ROCEPHIN ) 1 g in sodium chloride  0.9 % 100 mL IVPB  1 g 200 mL/hr over 30 Minutes Intravenous  Once 09/27/23 2304 09/28/23 0121   09/27/23 2315  metroNIDAZOLE  (FLAGYL ) IVPB 500 mg        500 mg 100 mL/hr over 60 Minutes Intravenous  Once 09/27/23 2304 09/28/23 0239        Subjective: Patient was seen and examined at bedside.   Overnight events noted. Patient reports feeling better,  sitting comfortably on the chair,  son is at bedside.  She is eager to go home.  Objective: Vitals:   09/29/23 1603 09/29/23 2010 09/30/23 0437 09/30/23 0753  BP: (!) 122/57 (!) 145/71 120/67 (!) 113/57  Pulse: 68 74 66 (!) 58  Resp: 17 16 16 17   Temp: 98 F (36.7 C) 98.6 F (37 C) 98.2 F (36.8 C) 98.3 F (36.8 C)  TempSrc: Oral Oral Oral Oral  SpO2: 97% 93% 95% 94%  Weight:      Height:        Intake/Output Summary (Last 24 hours) at 09/30/2023 1245 Last data filed at 09/30/2023 0856 Gross per 24 hour  Intake 338.34 ml  Output 100 ml  Net 238.34 ml   Filed Weights   09/28/23 0702  Weight: 68 kg    Examination:  General exam: Appears calm and comfortable, not in any acute distress. Deconditioned, Respiratory system: CTA Bilaterally. Respiratory effort normal.  RR 15 Cardiovascular system: S1 & S2 heard, RRR. No JVD, murmurs, rubs, gallops or clicks.  Gastrointestinal system: Abdomen is non distended, soft and non tender.  Normal bowel sounds heard. Central nervous system: Alert and oriented x 3. No focal neurological deficits. Extremities: No edema, no cyanosis, no clubbing. Skin: No rashes, lesions or ulcers Psychiatry: Judgement and insight appear normal. Mood & affect appropriate.     Data Reviewed: I have personally reviewed following labs and imaging studies  CBC: Recent Labs  Lab 09/27/23 1545 09/29/23 0558 09/30/23 0636  WBC 22.0* 11.4* 9.6  HGB 14.0 11.9* 11.8*  HCT 41.5 36.5 36.4  MCV 96.3 98.4 96.8  PLT 325 271 295   Basic Metabolic Panel: Recent Labs  Lab 09/27/23 1545 09/29/23 0558 09/30/23 0636  NA 139 138 140  K 4.3 3.8 3.6  CL 106 113* 109  CO2 21* 19* 22  GLUCOSE 116* 82 87  BUN 16 13 9   CREATININE 0.94 0.92 0.87  CALCIUM 9.4 8.1* 8.6*  MG  --   --  2.1  PHOS  --   --  3.5   GFR: Estimated Creatinine Clearance: 27.3 mL/min (by C-G formula based on SCr of 0.87  mg/dL). Liver Function Tests: Recent Labs  Lab 09/27/23 1545  AST 31  ALT 10  ALKPHOS 89  BILITOT 0.7  PROT 7.4  ALBUMIN 3.7   Recent Labs  Lab 09/27/23 1545  LIPASE 18   No results for input(s): AMMONIA in the last 168 hours. Coagulation Profile: No results for input(s): INR, PROTIME in the last 168 hours. Cardiac Enzymes: No results for input(s): CKTOTAL, CKMB, CKMBINDEX, TROPONINI in the last 168 hours. BNP (last 3 results) No results for input(s): PROBNP in the last 8760 hours. HbA1C: No results for input(s): HGBA1C in the last 72 hours. CBG: No results for input(s): GLUCAP in the last 168 hours. Lipid Profile: No results for input(s): CHOL, HDL, LDLCALC, TRIG, CHOLHDL, LDLDIRECT in the last 72 hours. Thyroid Function Tests: No results for input(s): TSH, T4TOTAL, FREET4, T3FREE, THYROIDAB in the last 72 hours. Anemia Panel: No results for input(s): VITAMINB12, FOLATE,  FERRITIN, TIBC, IRON, RETICCTPCT in the last 72 hours. Sepsis Labs: Recent Labs  Lab 09/27/23 1626  LATICACIDVEN 1.4    Recent Results (from the past 240 hours)  Urine Culture (for pregnant, neutropenic or urologic patients or patients with an indwelling urinary catheter)     Status: None   Collection Time: 09/28/23 10:32 PM   Specimen: Urine, Clean Catch  Result Value Ref Range Status   Specimen Description URINE, CLEAN CATCH  Final   Special Requests NONE  Final   Culture   Final    NO GROWTH Performed at [facility], [address] [place], [place] [address]    Report Status 09/29/2023 FINAL  Final     Radiology Studies: US  Paracentesis Result Date: 09/28/2023 INDICATION: 89 year old female with recent finding of pelvic mass and ascites. Request for therapeutic and diagnostic paracentesis. EXAM: ULTRASOUND GUIDED  PARACENTESIS MEDICATIONS: 10 mL 1% lidocaine  COMPLICATIONS: None immediate. PROCEDURE: Informed written consent was  obtained from the patient and her daughter after a discussion of the risks, benefits and alternatives to treatment. A timeout was performed prior to the initiation of the procedure. Initial ultrasound scanning demonstrates a small amount of ascites within the right lower abdominal quadrant. The right lower abdomen was prepped and draped in the usual sterile fashion. 1% lidocaine  was used for local anesthesia. Following this, a 19 gauge, 7-cm, Yueh catheter was introduced. An ultrasound image was saved for documentation purposes. The paracentesis was performed. The catheter was removed and a dressing was applied. The patient tolerated the procedure well without immediate post procedural complication. FINDINGS: A total of approximately 250 mL of hazy, light brown colored fluid was removed. Samples were sent to the laboratory as requested by the clinical team. IMPRESSION: Successful ultrasound-guided paracentesis yielding 250 mL of peritoneal fluid. Performed by: Aimee Han, PA-C Electronically Signed   By: CHRISTELLA.  Shick M.D.   On: 09/28/2023 15:31   Scheduled Meds:  enoxaparin  (LOVENOX ) injection  30 mg Subcutaneous Q24H   memantine   10 mg Oral BID   sodium chloride  flush  3 mL Intravenous Q12H   Continuous Infusions:  cefTRIAXone  (ROCEPHIN )  IV       LOS: 2 days    Time spent: 35 mins    Darcel Dawley, MD [facility]   If 7PM-7AM, please contact night-coverage

## 2023-09-30 NOTE — Plan of Care (Signed)

## 2023-09-30 NOTE — Plan of Care (Signed)
   Problem: Coping: Goal: Level of anxiety will decrease Outcome: Progressing   Problem: Pain Managment: Goal: General experience of comfort will improve and/or be controlled Outcome: Progressing   Problem: Safety: Goal: Ability to remain free from injury will improve Outcome: Progressing

## 2023-09-30 NOTE — TOC Initial Note (Signed)
 Transition of Care (TOC) - Initial/Assessment Note   NCM spoke to patient and daughter Kelly [name] at bedside.   Discussed PT recommendations for OP PT , rolling walker and 3 in1.   Patient already has rolling walker and 3 in 1 .   Provided choice for OP PT , daughter prefers [facility] location and she is contact person for appointments. Referral entered with daughters contact information , for MD to sign   Patient lives alone however has caregivers from 9 am to 11 pm. Two nights a week she has someone over night. Daughter currently arranging another overnight caregiver.  Patient Details  Name: Kelly [name] MRN: [id number] Date of Birth: 03-Jun-1934  Transition of Care [facility]) CM/SW Contact:    Kelly Powell Jansky, RN Phone Number: 09/30/2023, 2:04 PM  Clinical Narrative:                   Expected Discharge Plan: Home/Self Care Barriers to Discharge: Continued Medical Work up   Patient Goals and CMS Choice Patient states their goals for this hospitalization and ongoing recovery are:: to return to home CMS Medicare.gov Compare Post Acute Care list provided to:: Patient Represenative (must comment) (daughter Kelly [name]) Choice offered to / list presented to : Patient      Expected Discharge Plan and Services   Discharge Planning Services: CM Consult Post Acute Care Choice:  (OP PT) Living arrangements for the past 2 months: Single Family Home                 DME Arranged: N/A DME Agency: NA       HH Arranged: NA HH Agency: NA        Prior Living Arrangements/Services Living arrangements for the past 2 months: Single Family Home Lives with:: Self Patient language and need for interpreter reviewed:: Yes Do you feel safe going back to the place where you live?: Yes      Need for Family Participation in Patient Care: Yes (Comment) Care giver support system in place?: Yes (comment) Current home services: DME Criminal Activity/Legal Involvement Pertinent to  Current Situation/Hospitalization: No - Comment as needed  Activities of Daily Living   ADL Screening (condition at time of admission) Independently performs ADLs?: Yes (appropriate for developmental age) Is the patient deaf or have difficulty hearing?: Yes Does the patient have difficulty seeing, even when wearing glasses/contacts?: No Does the patient have difficulty concentrating, remembering, or making decisions?: Yes  Permission Sought/Granted   Permission granted to share information with : Yes, Verbal Permission Granted  Share Information with NAME: daughter Kelly [name]  Permission granted to share info w AGENCY: OP PT [facility]        Emotional Assessment Appearance:: Appears stated age Attitude/Demeanor/Rapport: Engaged Affect (typically observed): Appropriate Orientation: : Oriented to Self, Oriented to Place Alcohol  / Substance Use: Not Applicable Psych Involvement: No (comment)  Admission diagnosis:  Sepsis due to gram-negative UTI (HCC) [A41.50, N39.0] Patient Active Problem List   Diagnosis Date Noted   UTI (urinary tract infection) 09/28/2023   Leukocytosis 09/28/2023   Transient hypotension 09/28/2023   Dementia without behavioral disturbance (HCC) 09/28/2023   Pelvic mass 09/28/2023   Memory loss 03/10/2018   Closed intertrochanteric fracture of left hip, initial encounter (HCC) 09/18/2016   Closed comminuted intertrochanteric fracture of left femur (HCC) 09/18/2016   PCP:  Kelly Josette ORN., PA-C Pharmacy:   [facility] [phone] - [place], [place] - [address] [place] [address] Phone: [phone] Fax: [phone]  [name] [address] [place] [address] Phone: [phone] Fax: [phone]     Social Drivers of Health (SDOH) Social History: SDOH Screenings   Food Insecurity: No Food Insecurity (09/28/2023)  Housing: Low Risk   (09/28/2023)  Transportation Needs: No Transportation Needs (09/28/2023)  Utilities: Not At Risk (09/28/2023)  Physical Activity: Unknown (04/01/2019)   Received from [facility] visits prior to 06/09/2022.  Social Connections: Unknown (09/28/2023)  Tobacco Use: Medium Risk (09/28/2023)   SDOH Interventions:     Readmission Risk Interventions     No data to display

## 2023-10-01 ENCOUNTER — Other Ambulatory Visit (HOSPITAL_COMMUNITY): Payer: Self-pay

## 2023-10-01 DIAGNOSIS — R1900 Intra-abdominal and pelvic swelling, mass and lump, unspecified site: Secondary | ICD-10-CM | POA: Diagnosis not present

## 2023-10-01 DIAGNOSIS — I959 Hypotension, unspecified: Secondary | ICD-10-CM | POA: Diagnosis not present

## 2023-10-01 DIAGNOSIS — N390 Urinary tract infection, site not specified: Secondary | ICD-10-CM

## 2023-10-01 DIAGNOSIS — D72829 Elevated white blood cell count, unspecified: Secondary | ICD-10-CM | POA: Diagnosis not present

## 2023-10-01 DIAGNOSIS — K7031 Alcoholic cirrhosis of liver with ascites: Secondary | ICD-10-CM | POA: Diagnosis not present

## 2023-10-01 LAB — COMPREHENSIVE METABOLIC PANEL WITH GFR
ALT: 14 U/L (ref 0–44)
AST: 20 U/L (ref 15–41)
Albumin: 2.8 g/dL — ABNORMAL LOW (ref 3.5–5.0)
Alkaline Phosphatase: 59 U/L (ref 38–126)
Anion gap: 8 (ref 5–15)
BUN: 9 mg/dL (ref 8–23)
CO2: 23 mmol/L (ref 22–32)
Calcium: 8.7 mg/dL — ABNORMAL LOW (ref 8.9–10.3)
Chloride: 111 mmol/L (ref 98–111)
Creatinine, Ser: 0.84 mg/dL (ref 0.44–1.00)
GFR, Estimated: >60 mL/min (ref >60)
Glucose, Bld: 88 mg/dL (ref 70–99)
Potassium: 3.6 mmol/L (ref 3.5–5.1)
Sodium: 142 mmol/L (ref 135–145)
Total Bilirubin: 0.3 mg/dL (ref 0.0–1.2)
Total Protein: 6.4 g/dL — ABNORMAL LOW (ref 6.5–8.1)

## 2023-10-01 LAB — CBC
HCT: 37.7 % (ref 36.0–46.0)
Hemoglobin: 12.5 g/dL (ref 12.0–15.0)
MCH: 32.1 pg (ref 26.0–34.0)
MCHC: 33.2 g/dL (ref 30.0–36.0)
MCV: 96.9 fL (ref 80.0–100.0)
Platelets: 318 10*3/uL (ref 150–400)
RBC: 3.89 MIL/uL (ref 3.87–5.11)
RDW: 12.6 % (ref 11.5–15.5)
WBC: 8 10*3/uL (ref 4.0–10.5)
nRBC: 0 % (ref 0.0–0.2)

## 2023-10-01 LAB — PHOSPHORUS: Phosphorus: 3.9 mg/dL (ref 2.5–4.6)

## 2023-10-01 LAB — CYTOLOGY - NON PAP

## 2023-10-01 LAB — MAGNESIUM: Magnesium: 2.2 mg/dL (ref 1.7–2.4)

## 2023-10-01 MED ORDER — AMOXICILLIN-POT CLAVULANATE 875-125 MG PO TABS
1.0000 | ORAL_TABLET | Freq: Two times a day (BID) | ORAL | 0 refills | Status: AC
Start: 2023-10-01 — End: 2023-10-04
  Filled 2023-10-01: qty 6, 3d supply, fill #0

## 2023-10-01 NOTE — Consult Note (Addendum)
 I have seen and examined the patient. I have personally reviewed the clinical findings, laboratory findings, microbiological data and imaging studies. The assessment and treatment plan was discussed with the Nurse Practitioner. I agree with her/his recommendations except following additions/corrections.  89 year old female with prior history of hypertension, dementia  who presented to the ED on 6/20 with acute onset generalized abdominal pain starting a day before. Pain got better after she has a large BM next day, No reported fevers, GI or GU symptoms or sick contacts.   At ED, initially afebrile but T max 100.5 Labs remarkable for WBC 22, lactic acid 1.4, no blood cx on admission, UA with small leukocytes, RBCs and some bacteria  ( no GU symptoms) Urine cx NG  CTAP with concern for Cervical/uterine/ovarian malignancy with possible extension to left adnexa and small volume ascites, multiple gallstones Case discussed with GYN Was given IV ceftriaxone , IV metronidazole  and Tylenol   S/p right lower abdomen paracentesis on 6/21 with 250 mL of hazy light brown fluid -WBC greater than 10K, and 57, no albumin on same date to calculate SAAG but greater than 1.1 using serum albumin from 6/20). No cx sent, cytology pending. CA 125 elevated to 969  Exam -elderly female sitting in the chair, pleasant, baseline mental status per daughter at bedside.  HEENT WNL, heart lung and abdomen within normal limits, no abdominal distention or tenderness.  No signs of septic peripheral joints of septic peripheral joint or rashes.   A/P Suspect ascites related to gyn malignancy and unlikely SBP/infective etiology  Will switch to PO augmentin to complete 7 days, EOT 6/27 Fu cytology Needs fu with Gynecology. OK to DC from ID standpoint if OK with Gyn  Universal/standard isolation precautions  ID will so, recall back with questions or concerns   I spent 81 minutes involved in face-to-face and non-face-to-face  activities for this patient on the day of the visit. Professional time spent includes the following activities: Preparing to see the patient (review of tests), Obtaining and reviewing separately obtained history (admission/discharge record), Performing a medically appropriate examination and evaluation , Ordering medications/labs, referring and communicating with other health care professionals, Documenting clinical information in the EMR, Independently interpreting results (not separately reported), Communicating results to the patient/family/caregiver, Counseling and educating the patient/family/caregiver and Care coordination (not separately reported).     [facility]    Date of Admission:  09/27/2023     Total days of antibiotics                Reason for Consult: Peritonitis   Referring Provider: Dr. Leotis Primary Care Provider: Debrah Josette ORN., PA-C   ASSESSMENT:  Ms. Yamamoto is an 89 year old Caucasian female presenting with lower abdominal pain and found to have a left pelvic/adnexal mass likely representing malignancy and concern for possible peritonitis. CA 125 elevated and cytology remains pending. No cultures have been obtained. No clear evidence of acute bacterial peritonitis or acute infection. Urine cultures were without growth. Suspect this is likely related to pelvic mass. For thoroughness will complete 7 days of antibiotic treatment and change ceftriaxone /metronidazole   to amoxicillin/clavulanate 875/125 mg PO bid for 3 additional days for total of 7 days of treatment.  Recommend follow-up with gynecology/oncology outpatient.  Okay for discharge from ID standpoint.  Remaining medical and supportive care per internal medicine.  PLAN:  Change antibiotics to amoxicillin/clavulanate 875/125 mg p.o. twice daily for 3 days. Follow-up with gynecology/oncology Okay for discharge from ID standpoint Universal/standard precautions.  Remaining medical and  supportive care per internal medicine.   Active Problems:   UTI (urinary tract infection)   Leukocytosis   Transient hypotension   Dementia without behavioral disturbance (HCC)   Pelvic mass    enoxaparin  (LOVENOX ) injection  30 mg Subcutaneous Q24H   memantine   10 mg Oral BID   sodium chloride  flush  3 mL Intravenous Q12H     HPI: Larry Knipp is a 89 y.o. female with previous medical history of hypertension and mild/moderate dementia presenting to the ED with acute abdominal pain.  Ms. Wilensky began having abdominal pain on 09/26/23, a day prior to presentation. Had a bowel movement which initially helped the pain but it returned while driving [place] railroad crossing. Mostly resolved upon arrival to the ED. Temperature of 100.5 F with leukocytosis of 22,000.  CT abdomen/pelvis with diffusely abnormal appearance of the uterus contiguous with a left pelvic/adnexal dominant mass concerning for malignancy; and small volume abdominopelvic ascites without evidence of peritoneal metastasis.  Ultrasound abdomen of the right upper quadrant with cholelithiasis; fatty infiltration of liver; and trace perihepatic ascites.  Gynecology recommended admission with IR drainage of ascites with cytology.  Peritoneal fluid with >10,000 total nucleated cell count, 57 neutrophils and LD fluid 974.  Was given 1 g IV ceftriaxone  and metronidazole .  There were no blood cultures or peritoneal cultures obtained.  Urine culture obtained with no growth.  Cancer antigen 125 elevated at 969.  Gynecology recommends follow-up with outpatient for further evaluation.  Daughter present with Ms. Brennan and provides majority of the history. Afebrile since initial temperature and improved leukocytosis. No current abdominal pain.   Review of Systems: Review of Systems  Constitutional:  Negative for chills, fever and weight loss.  Respiratory:  Negative for cough, shortness of breath and wheezing.   Cardiovascular:   Negative for chest pain and leg swelling.  Gastrointestinal:  Negative for abdominal pain, constipation, diarrhea, nausea and vomiting.  Skin:  Negative for rash.     Past Medical History:  Diagnosis Date   Hypertension    Memory loss    Past Surgical History:  Procedure Laterality Date   INTRAMEDULLARY (IM) NAIL INTERTROCHANTERIC Left 09/18/2016   Procedure: INTRAMEDULLARY (IM) NAIL INTERTROCHANTRIC;  Surgeon: Fidel Rogue, MD;  Location: [facility];  Service: Orthopedics;  Laterality: Left;    Social History   Tobacco Use   Smoking status: Passive Smoke Exposure - Never Smoker   Smokeless tobacco: Never  Substance Use Topics   Alcohol  use: No   Drug use: Never    Family History  Problem Relation Age of Onset   Hypertension Mother    Heart failure Mother    Hearing loss Father    Aneurysm Father        brain   Breast cancer Neg Hx     No Known Allergies  OBJECTIVE: Blood pressure (!) 145/76, pulse 64, temperature 98.2 F (36.8 C), temperature source Oral, resp. rate 17, height 4' (1.219 m), weight 68 kg, SpO2 95%.  Physical Exam Constitutional:      General: She is not in acute distress.    Appearance: She is well-developed.   Cardiovascular:     Rate and Rhythm: Normal rate and regular rhythm.     Heart sounds: Normal heart sounds.  Pulmonary:     Effort: Pulmonary effort is normal.     Breath sounds: Normal breath sounds.   Skin:    General: Skin is warm and dry.   Neurological:  Mental Status: She is alert. She is disoriented.     Lab Results Lab Results  Component Value Date   WBC 8.0 10/01/2023   HGB 12.5 10/01/2023   HCT 37.7 10/01/2023   MCV 96.9 10/01/2023   PLT 318 10/01/2023    Lab Results  Component Value Date   CREATININE 0.84 10/01/2023   BUN 9 10/01/2023   NA 142 10/01/2023   K 3.6 10/01/2023   CL 111 10/01/2023   CO2 23 10/01/2023    Lab Results  Component Value Date   ALT 14 10/01/2023   AST 20 10/01/2023   ALKPHOS  59 10/01/2023   BILITOT 0.3 10/01/2023     Microbiology: Recent Results (from the past 240 hours)  Urine Culture (for pregnant, neutropenic or urologic patients or patients with an indwelling urinary catheter)     Status: None   Collection Time: 09/28/23 10:32 PM   Specimen: Urine, Clean Catch  Result Value Ref Range Status   Specimen Description URINE, CLEAN CATCH  Final   Special Requests NONE  Final   Culture   Final    NO GROWTH Performed at [facility], [address], [place], [place] [address]    Report Status 09/29/2023 FINAL  Final   Imaging US  Paracentesis Result Date: 09/28/2023 INDICATION: 89 year old female with recent finding of pelvic mass and ascites. Request for therapeutic and diagnostic paracentesis. EXAM: ULTRASOUND GUIDED  PARACENTESIS MEDICATIONS: 10 mL 1% lidocaine  COMPLICATIONS: None immediate. PROCEDURE: Informed written consent was obtained from the patient and her daughter after a discussion of the risks, benefits and alternatives to treatment. A timeout was performed prior to the initiation of the procedure. Initial ultrasound scanning demonstrates a small amount of ascites within the right lower abdominal quadrant. The right lower abdomen was prepped and draped in the usual sterile fashion. 1% lidocaine  was used for local anesthesia. Following this, a 19 gauge, 7-cm, Yueh catheter was introduced. An ultrasound image was saved for documentation purposes. The paracentesis was performed. The catheter was removed and a dressing was applied. The patient tolerated the procedure well without immediate post procedural complication. FINDINGS: A total of approximately 250 mL of hazy, light brown colored fluid was removed. Samples were sent to the laboratory as requested by the clinical team. IMPRESSION: Successful ultrasound-guided paracentesis yielding 250 mL of peritoneal fluid. Performed by: Aimee Han, PA-C Electronically Signed   By: CHRISTELLA.  Shick M.D.   On: 09/28/2023  15:31   US  Abdomen Limited RUQ (LIVER/GB) Result Date: 09/27/2023 CLINICAL DATA:  151471 RUQ pain 151471 EXAM: ULTRASOUND ABDOMEN LIMITED RIGHT UPPER QUADRANT COMPARISON:  None Available. FINDINGS: Gallbladder: Several shadowing gallstones. No pericholecystic fluid or wall thickening. Common bile duct: Diameter: 0.2 cm. Liver: Liver is hyperechoic consistent with fatty infiltration. No focal hepatic lesions identified. No intrahepatic ductal dilatation. Hepatopetal portal vein flow. Trace perihepatic ascites. IMPRESSION: 1. Cholelithiasis. 2. Fatty infiltration of the liver. 3. Trace perihepatic ascites. Electronically Signed   By: Fonda Field M.D.   On: 09/27/2023 20:08   CT ABDOMEN PELVIS W CONTRAST Result Date: 09/27/2023 CLINICAL DATA:  Generalized abdominal pain. * Tracking Code: BO * EXAM: CT ABDOMEN AND PELVIS WITH CONTRAST TECHNIQUE: Multidetector CT imaging of the abdomen and pelvis was performed using the standard protocol following bolus administration of intravenous contrast. RADIATION DOSE REDUCTION: This exam was performed according to the departmental dose-optimization program which includes automated exposure control, adjustment of the mA and/or kV according to patient size and/or use of iterative reconstruction  technique. CONTRAST:  OMNIPAQUE  IOHEXOL  300 MG/ML  SOLN COMPARISON:  None Available. FINDINGS: Lower chest: Bibasilar scarring mild cardiomegaly, without pericardial or pleural effusion. Moderate hiatal hernia. Hepatobiliary: Normal liver. Multiple gallstones on the order of 6 mm without acute cholecystitis or biliary duct dilatation. Pancreas: Normal, without mass or ductal dilatation. Spleen: Normal in size, without focal abnormality. Adrenals/Urinary Tract: Normal adrenal glands. Left renal too small to characterize lesions are most likely cysts . In the absence of clinically indicated signs/symptoms require(s) no independent follow-up. Normal right kidney. No  hydronephrosis. Normal urinary bladder. Stomach/Bowel: Normal remainder of the stomach. Normal colon and terminal ileum. Appendix not visualized. Normal small bowel. Vascular/Lymphatic: Aortic atherosclerosis. No abdominopelvic adenopathy. Reproductive: The uterus has a diffusely abnormal morphology, with soft tissue fullness throughout. Example cervical enlargement at 5.5 x 5.6 cm on 73/3. This is contiguous with a macrolobulated left upper pelvic/adnexal heterogeneous mass of 10.2 x 8.8 cm on 62/3. Other: Pelvic floor laxity. Small volume abdominopelvic ascites. No well-defined omental/peritoneal metastasis. Musculoskeletal: Osteopenia. Left proximal femur fixation. Grade 1 L4-5 anterolisthesis. Mild convex left lumbar spine curvature. IMPRESSION: 1. Diffusely abnormal appearance of the uterus, contiguous with a left pelvic/adnexal dominant mass. Findings are favored to represent either cervical/uterine primary with extension into the left adnexa or synchronous cervical/uterine and ovarian primaries. Recommend gynecological consultation. 2. Small volume abdominopelvic ascites, without evidence of peritoneal metastasis. 3. No bowel obstruction or other acute complication. 4. Incidental findings, including: Moderate hiatal hernia. Cholelithiasis. Pelvic floor laxity. Electronically Signed   By: Rockey Kilts M.D.   On: 09/27/2023 18:27     I have personally spent 30 minutes involved in face-to-face and non-face-to-face activities for this patient on the day of the visit. Professional time spent includes the following activities: preparing to see the patient (review of tests), obtaining and reviewing separately obtained history (admission/discharge record), performing a medically appropriate examination ordering  medications/tests/procedures, communicating with other health care professionals, documenting clinical information in the EMR, communicating results to the patient/family, Counseling and educating  patient/family and care coordination.   Greg Calone, NP [facility]  10/01/2023  1:56 PM

## 2023-10-01 NOTE — Progress Notes (Signed)
 Mobility Specialist: Progress Note   10/01/23 1155  Mobility  Activity Ambulated with assistance in hallway  Level of Assistance Contact guard assist, steadying assist  Assistive Device Burna;Other (Comment) (HHA)  Distance Ambulated (ft) 250 ft  Activity Response Tolerated well  Mobility Referral Yes  Mobility visit 1 Mobility  Mobility Specialist Start Time (ACUTE ONLY) [id number]  Mobility Specialist Stop Time (ACUTE ONLY) 1008  Mobility Specialist Time Calculation (min) (ACUTE ONLY) 16 min    Pt received in chair, agreeable to mobility session. Daughter present and helpful. CG with HHA in L hand and SPC in R hand. No complaints. Returned to room without fault. Left in chair with all needs met, call bell in reach. Daughter present.   Ileana Lute Mobility Specialist Please contact via SecureChat or Rehab office at [phone]

## 2023-10-01 NOTE — Discharge Instructions (Signed)
 Advised to follow-up with gynecology as scheduled. Patient advised to take Augmentin twice daily for 3 days.

## 2023-10-01 NOTE — Care Management Important Message (Signed)
 Important Message  Patient Details  Name: Kelly [name] MRN: [id number] Date of Birth: Mar 25, 1935   Important Message Given:  Yes - Medicare IM     Jon Cruel 10/01/2023, 12:00 PM

## 2023-10-01 NOTE — Discharge Summary (Signed)
 Physician Discharge Summary  Nelwyn Hebdon FMW:[id number] DOB: [birth year] DOA: 09/27/2023  PCP: Debrah Josette ORN., PA-C  Admit date: 09/27/2023  Discharge date: 10/01/2023  Admitted From: Home  Disposition:  Home  Recommendations for Outpatient Follow-up:  Follow up with PCP in 1-2 weeks. Please obtain BMP/CBC in one week. Advised to follow-up with gynecology as scheduled. Patient advised to take Augmentin twice daily for 3 days.  Home Health:None Equipment/Devices:None  Discharge Condition: Stable CODE STATUS:Full code Diet recommendation: Heart Healthy   Brief [facility] Course: This 89 yrs old female with medical history significant of dementia and HTN presenting in the ED with abdominal pain. She has significant short-term memory issues, characterized by frequent repetition and a lack of awareness of her surroundings. She lives at home and is assisted by healthcare workers during the day. In ED  Labs significant for leukocytosis of 22 and lactic acid 1.4.  Urinalysis suspicious for urinary tract infection.  Abdominal CT with contrast revealed diffusely abnormal appearance of the uterus, contiguous with a left pelvic/adnexal dominant mass. Findings are favored to represent either cervical/uterine primary with extension into the left adnexa or synchronous cervical/uterine and ovarian primaries.  Patient was admitted for further evaluation and started on empiric antibiotic Rocephin  and metronidazole .  OB/GYN was consulted. Patient underwent paracentesis , found to have SBP, CA 125 > 969, ID consulted, recommended patient can be discharged on augmentin bid for three days. GYN recommended out patient follow up. Patient wants to be discharged home.  Discharge Diagnoses:  Active Problems:   UTI (urinary tract infection)   Pelvic mass   Leukocytosis   Transient hypotension   Dementia without behavioral disturbance (HCC)   Urinary tract infection: POA Patient noted to have fever  up to 100.5 F and complaints of abdominal pain.   Urinalysis noted moderate hemoglobin, small leukocytes, 21-50 RBC/hpf, and 11-20 WBCs.   Initiated on empiric antibiotics IV Rocephin . Urine culture > No growth  Tylenol  as needed for fever/ mild pain.   Pelvic mass: CT A/P noted diffusely abnormal appearance of the uterus, contiguous with a left pelvic/adnexal dominant mass. Findings are favored to represent either cervical/uterine primary with extension into the left adnexa or synchronous cervical/uterine and ovarian primaries. Check ESR(40) and CRP(20.1) Check LDH and CA125 IR consulted for paracentesis with lab orders for cell count and differential, total protein and albumin, cytology, LDH, and glucose placed Will need referral to gynecology oncology in the outpatient setting. Ascitic fluid Labs consistent with SBP. Continue Ceftriaxone  2 g daily. ID recommended agmentin for three days.     Leukocytosis: WBC elevated at 22.  Lactic acid was reassuring at 1.4.   Suspect leukocytosis secondary to above.   Patient did not appear to meet sepsis criteria. Recheck CBC showed improved WBC.   Transient hypotension: On admission blood pressures noted to be as low as 92/50, but improved without intervention.   Suspect patient to be only slightly dehydrated given the elevated specific gravity noted on urinalysis. Hold home blood pressure medication of losartan Goal MAP greater than 65 Continue IV fluids at 75 mL/h for 1 L   Dementia: Patient has poor short-term memory. Delirium precautions Continue Namenda .   Discharge Instructions  Discharge Instructions     Ambulatory referral to Physical Therapy   Complete by: As directed    Iontophoresis - 4 mg/ml of dexamethasone : No   T.E.N.S. Unit Evaluation and Dispense as Indicated: No   Call daughter to schedule appointment [facility] [phone]  Gait and stable   Call MD for:  difficulty breathing, headache or visual  disturbances   Complete by: As directed    Call MD for:  persistant dizziness or light-headedness   Complete by: As directed    Call MD for:  persistant nausea and vomiting   Complete by: As directed    Diet - low sodium heart healthy   Complete by: As directed    Diet Carb Modified   Complete by: As directed    Discharge instructions   Complete by: As directed    Advised to follow-up with primary care physician in 1 week. Advised to follow-up with gynecology as scheduled. Patient advised to take Augmentin twice daily for 3 days.   Increase activity slowly   Complete by: As directed       Allergies as of 10/01/2023   No Known Allergies      Medication List     TAKE these medications    amoxicillin-clavulanate 875-125 MG tablet Commonly known as: AUGMENTIN Take 1 tablet by mouth 2 (two) times daily for 3 days.   CALCIUM-MAGNESIUM-ZINC-D3 PO Take 1 tablet by mouth daily.   Cholecalciferol  25 MCG (1000 UT) tablet Take 1,000 Units by mouth daily.   losartan 25 MG tablet Commonly known as: COZAAR Take 25 mg by mouth daily.   memantine  10 MG tablet Commonly known as: NAMENDA  Take 1 tablet (10 mg total) by mouth 2 (two) times daily.        Follow-up Information     [facility] Follow up.   Specialty: Rehabilitation Contact information: [address] [place]  [address] [phone]               No Known Allergies  Consultations: Gyn Oncology Infectious diseases    Procedures/Studies: US  Paracentesis Result Date: 09/28/2023 INDICATION: 89 year old female with recent finding of pelvic mass and ascites. Request for therapeutic and diagnostic paracentesis. EXAM: ULTRASOUND GUIDED  PARACENTESIS MEDICATIONS: 10 mL 1% lidocaine  COMPLICATIONS: None immediate. PROCEDURE: Informed written consent was obtained from the patient and her daughter after a discussion of the risks, benefits and  alternatives to treatment. A timeout was performed prior to the initiation of the procedure. Initial ultrasound scanning demonstrates a small amount of ascites within the right lower abdominal quadrant. The right lower abdomen was prepped and draped in the usual sterile fashion. 1% lidocaine  was used for local anesthesia. Following this, a 19 gauge, 7-cm, Yueh catheter was introduced. An ultrasound image was saved for documentation purposes. The paracentesis was performed. The catheter was removed and a dressing was applied. The patient tolerated the procedure well without immediate post procedural complication. FINDINGS: A total of approximately 250 mL of hazy, light brown colored fluid was removed. Samples were sent to the laboratory as requested by the clinical team. IMPRESSION: Successful ultrasound-guided paracentesis yielding 250 mL of peritoneal fluid. Performed by: Aimee Han, PA-C Electronically Signed   By: CHRISTELLA.  Shick M.D.   On: 09/28/2023 15:31   US  Abdomen Limited RUQ (LIVER/GB) Result Date: 09/27/2023 CLINICAL DATA:  151471 RUQ pain 151471 EXAM: ULTRASOUND ABDOMEN LIMITED RIGHT UPPER QUADRANT COMPARISON:  None Available. FINDINGS: Gallbladder: Several shadowing gallstones. No pericholecystic fluid or wall thickening. Common bile duct: Diameter: 0.2 cm. Liver: Liver is hyperechoic consistent with fatty infiltration. No focal hepatic lesions identified. No intrahepatic ductal dilatation. Hepatopetal portal vein flow. Trace perihepatic ascites. IMPRESSION: 1. Cholelithiasis. 2. Fatty infiltration of the liver. 3. Trace perihepatic ascites.  Electronically Signed   By: Fonda Field M.D.   On: 09/27/2023 20:08   CT ABDOMEN PELVIS W CONTRAST Result Date: 09/27/2023 CLINICAL DATA:  Generalized abdominal pain. * Tracking Code: BO * EXAM: CT ABDOMEN AND PELVIS WITH CONTRAST TECHNIQUE: Multidetector CT imaging of the abdomen and pelvis was performed using the standard protocol following bolus  administration of intravenous contrast. RADIATION DOSE REDUCTION: This exam was performed according to the departmental dose-optimization program which includes automated exposure control, adjustment of the mA and/or kV according to patient size and/or use of iterative reconstruction technique. CONTRAST:  OMNIPAQUE  IOHEXOL  300 MG/ML  SOLN COMPARISON:  None Available. FINDINGS: Lower chest: Bibasilar scarring mild cardiomegaly, without pericardial or pleural effusion. Moderate hiatal hernia. Hepatobiliary: Normal liver. Multiple gallstones on the order of 6 mm without acute cholecystitis or biliary duct dilatation. Pancreas: Normal, without mass or ductal dilatation. Spleen: Normal in size, without focal abnormality. Adrenals/Urinary Tract: Normal adrenal glands. Left renal too small to characterize lesions are most likely cysts . In the absence of clinically indicated signs/symptoms require(s) no independent follow-up. Normal right kidney. No hydronephrosis. Normal urinary bladder. Stomach/Bowel: Normal remainder of the stomach. Normal colon and terminal ileum. Appendix not visualized. Normal small bowel. Vascular/Lymphatic: Aortic atherosclerosis. No abdominopelvic adenopathy. Reproductive: The uterus has a diffusely abnormal morphology, with soft tissue fullness throughout. Example cervical enlargement at 5.5 x 5.6 cm on 73/3. This is contiguous with a macrolobulated left upper pelvic/adnexal heterogeneous mass of 10.2 x 8.8 cm on 62/3. Other: Pelvic floor laxity. Small volume abdominopelvic ascites. No well-defined omental/peritoneal metastasis. Musculoskeletal: Osteopenia. Left proximal femur fixation. Grade 1 L4-5 anterolisthesis. Mild convex left lumbar spine curvature. IMPRESSION: 1. Diffusely abnormal appearance of the uterus, contiguous with a left pelvic/adnexal dominant mass. Findings are favored to represent either cervical/uterine primary with extension into the left adnexa or synchronous  cervical/uterine and ovarian primaries. Recommend gynecological consultation. 2. Small volume abdominopelvic ascites, without evidence of peritoneal metastasis. 3. No bowel obstruction or other acute complication. 4. Incidental findings, including: Moderate hiatal hernia. Cholelithiasis. Pelvic floor laxity. Electronically Signed   By: Rockey Kilts M.D.   On: 09/27/2023 18:27      Subjective: Patient feels better and wants to be discharged home.  Discharge Exam: Vitals:   10/01/23 0728 10/01/23 1531  BP: (!) 145/76 136/68  Pulse: 64 66  Resp: 17 16  Temp: 98.2 F (36.8 C) 98.7 F (37.1 C)  SpO2: 95% 95%   Vitals:   09/30/23 2116 10/01/23 0431 10/01/23 0728 10/01/23 1531  BP: 135/67 (!) 146/77 (!) 145/76 136/68  Pulse: 66 67 64 66  Resp: 18 18 17 16   Temp: 97.9 F (36.6 C) 98 F (36.7 C) 98.2 F (36.8 C) 98.7 F (37.1 C)  TempSrc: Oral Oral Oral Oral  SpO2: 92% 97% 95% 95%  Weight:      Height:        General: Pt is alert, awake, not in acute distress Cardiovascular: RRR, S1/S2 +, no rubs, no gallops Respiratory: CTA bilaterally, no wheezing, no rhonchi Abdominal: Soft, NT, ND, bowel sounds + Extremities: no edema, no cyanosis    The results of significant diagnostics from this hospitalization (including imaging, microbiology, ancillary and laboratory) are listed below for reference.     Microbiology: Recent Results (from the past 240 hours)  Urine Culture (for pregnant, neutropenic or urologic patients or patients with an indwelling urinary catheter)     Status: None   Collection Time: 09/28/23 10:32 PM   Specimen:  Urine, Clean Catch  Result Value Ref Range Status   Specimen Description URINE, CLEAN CATCH  Final   Special Requests NONE  Final   Culture   Final    NO GROWTH Performed at [facility], [address], [place], [place] [address]    Report Status 09/29/2023 FINAL  Final     Labs: BNP (last 3 results) No results for input(s): BNP in  the last 8760 hours. Basic Metabolic Panel: Recent Labs  Lab 09/27/23 1545 09/29/23 0558 09/30/23 0636 10/01/23 0632  NA 139 138 140 142  K 4.3 3.8 3.6 3.6  CL 106 113* 109 111  CO2 21* 19* 22 23  GLUCOSE 116* 82 87 88  BUN 16 13 9 9   CREATININE 0.94 0.92 0.87 0.84  CALCIUM 9.4 8.1* 8.6* 8.7*  MG  --   --  2.1 2.2  PHOS  --   --  3.5 3.9   Liver Function Tests: Recent Labs  Lab 09/27/23 1545 10/01/23 0632  AST 31 20  ALT 10 14  ALKPHOS 89 59  BILITOT 0.7 0.3  PROT 7.4 6.4*  ALBUMIN 3.7 2.8*   Recent Labs  Lab 09/27/23 1545  LIPASE 18   No results for input(s): AMMONIA in the last 168 hours. CBC: Recent Labs  Lab 09/27/23 1545 09/29/23 0558 09/30/23 0636 10/01/23 0632  WBC 22.0* 11.4* 9.6 8.0  HGB 14.0 11.9* 11.8* 12.5  HCT 41.5 36.5 36.4 37.7  MCV 96.3 98.4 96.8 96.9  PLT 325 271 295 318   Cardiac Enzymes: No results for input(s): CKTOTAL, CKMB, CKMBINDEX, TROPONINI in the last 168 hours. BNP: Invalid input(s): POCBNP CBG: No results for input(s): GLUCAP in the last 168 hours. D-Dimer No results for input(s): DDIMER in the last 72 hours. Hgb A1c No results for input(s): HGBA1C in the last 72 hours. Lipid Profile No results for input(s): CHOL, HDL, LDLCALC, TRIG, CHOLHDL, LDLDIRECT in the last 72 hours. Thyroid function studies No results for input(s): TSH, T4TOTAL, T3FREE, THYROIDAB in the last 72 hours.  Invalid input(s): FREET3 Anemia work up No results for input(s): VITAMINB12, FOLATE, FERRITIN, TIBC, IRON, RETICCTPCT in the last 72 hours. Urinalysis    Component Value Date/Time   COLORURINE YELLOW 09/27/2023 1545   APPEARANCEUR CLEAR 09/27/2023 1545   LABSPEC >1.046 (H) 09/27/2023 1545   PHURINE 6.5 09/27/2023 1545   GLUCOSEU NEGATIVE 09/27/2023 1545   HGBUR MODERATE (A) 09/27/2023 1545   BILIRUBINUR NEGATIVE 09/27/2023 1545   KETONESUR NEGATIVE 09/27/2023 1545   PROTEINUR 30 (A)  09/27/2023 1545   NITRITE NEGATIVE 09/27/2023 1545   LEUKOCYTESUR SMALL (A) 09/27/2023 1545   Sepsis Labs Recent Labs  Lab 09/27/23 1545 09/29/23 0558 09/30/23 0636 10/01/23 0632  WBC 22.0* 11.4* 9.6 8.0   Microbiology Recent Results (from the past 240 hours)  Urine Culture (for pregnant, neutropenic or urologic patients or patients with an indwelling urinary catheter)     Status: None   Collection Time: 09/28/23 10:32 PM   Specimen: Urine, Clean Catch  Result Value Ref Range Status   Specimen Description URINE, CLEAN CATCH  Final   Special Requests NONE  Final   Culture   Final    NO GROWTH Performed at [facility], [address], [place], [place] [address]    Report Status 09/29/2023 FINAL  Final     Time coordinating discharge: Over 30 minutes  SIGNED:   Darcel Dawley, MD  [facility] 10/01/2023, 10:45 PM Pager  If 7PM-7AM, please contact night-coverage

## 2023-10-01 NOTE — Progress Notes (Addendum)
 Physical Therapy Treatment Patient Details Name: Kelly [name] MRN: [id number] DOB: [birth year] Today's Date: 10/01/2023   History of Present Illness Admitted with Abdominal pain, possible sepsis due to UTI and pelvic mass with ascites concerning for uterine or ovarian tumor; PMHx dementia and HTN    PT Comments  Pt up in chair on arrival, pleasant and agreeable to session with continued progress towards acute goals. Pt demonstrating increased activity tolerance, progressing gait with SPC on R and HHA on L (per pt request) with grossly CGA for safety. Pt without overt LOB throughout gait however pt with some slight veering R with pt able to self correct. Current plan remains appropriate to address deficits and maximize functional independence and safety. Educated pt and family on importance of continued and frequent mobility as well as appropriate activity progression with pt verbalizing understanding. Pt continues to benefit from skilled PT services to progress toward functional mobility goals.      If plan is discharge home, recommend the following: A little help with walking and/or transfers;A little help with bathing/dressing/bathroom;Assistance with cooking/housework;Assist for transportation;Help with stairs or ramp for entrance;Supervision due to cognitive status   Can travel by private vehicle        Equipment Recommendations  Rolling walker (2 wheels);BSC/3in1 (She may already have these)    Recommendations for Other Services Other (comment) (Mobility team)     Precautions / Restrictions Precautions Precautions: Fall Restrictions Weight Bearing Restrictions Per Provider Order: No     Mobility  Bed Mobility Overal bed mobility: Needs Assistance             General bed mobility comments: up in chair on arrival    Transfers Overall transfer level: Needs assistance Equipment used: Straight cane Transfers: Sit to/from Stand Sit to Stand: Min assist            General transfer comment: Slow rise, and needed 2 tries to get up; min assist to steady during power up, CGA to rise from [facility]    Ambulation/Gait Ambulation/Gait assistance: Contact guard assist Gait Distance (Feet): 250 Feet Assistive device: Straight cane, 1 person hand held assist Gait Pattern/deviations: Step-through pattern Gait velocity: decr     General Gait Details: Slow pace, but no overt losses of balance, preference for HHA on L + SPC on R, some veering R with pt able to self correct   Stairs             Wheelchair Mobility     Tilt Bed    Modified Rankin (Stroke Patients Only)       Balance Overall balance assessment: Mild deficits observed, not formally tested, History of Falls (Daughter reports took a fall within the past 3 months, when getting up during the night)                                          Communication Communication Communication: Impaired Factors Affecting Communication: Hearing impaired  Cognition Arousal: Alert Behavior During Therapy: WFL for tasks assessed/performed   PT - Cognitive impairments: History of cognitive impairments                       PT - Cognition Comments: short-term memory deficits with dementia Following commands: Intact      Cueing Cueing Techniques: Verbal cues  Exercises      General Comments General comments (skin integrity, edema,  etc.): Daughter present and supportive      Pertinent Vitals/Pain Pain Assessment Pain Assessment: No/denies pain    Home Living                          Prior Function            PT Goals (current goals can now be found in the care plan section) Acute Rehab PT Goals Patient Stated Goal: Hopes to be home soon PT Goal Formulation: With patient/family Time For Goal Achievement: 10/13/23 Progress towards PT goals: Progressing toward goals    Frequency    Min 2X/week      PT Plan      Co-evaluation               AM-PAC PT 6 Clicks Mobility   Outcome Measure  Help needed turning from your back to your side while in a flat bed without using bedrails?: A Little Help needed moving from lying on your back to sitting on the side of a flat bed without using bedrails?: A Little Help needed moving to and from a bed to a chair (including a wheelchair)?: A Little Help needed standing up from a chair using your arms (e.g., wheelchair or bedside chair)?: A Little Help needed to walk in hospital room?: A Little Help needed climbing 3-5 steps with a railing? : A Little 6 Click Score: 18    End of Session Equipment Utilized During Treatment: Gait belt Activity Tolerance: Patient tolerated treatment well Patient left: in chair;with call bell/phone within reach;with family/visitor present Nurse Communication: Mobility status PT Visit Diagnosis: Unsteadiness on feet (R26.81)     Time: 8888-8872 PT Time Calculation (min) (ACUTE ONLY): 16 min  Charges:    $Gait Training: 8-22 mins PT General Charges $$ ACUTE PT VISIT: 1 Visit                     Therisa R. PTA Acute Rehabilitation Services Office: [phone]   Therisa CHRISTELLA Boor 10/01/2023, 12:35 PM

## 2023-10-01 NOTE — Plan of Care (Signed)
  Problem: Clinical Measurements: Goal: Ability to maintain clinical measurements within normal limits will improve Outcome: Progressing   Problem: Activity: Goal: Risk for activity intolerance will decrease Outcome: Progressing   Problem: Nutrition: Goal: Adequate nutrition will be maintained Outcome: Progressing   Problem: Coping: Goal: Level of anxiety will decrease Outcome: Progressing   Problem: Safety: Goal: Ability to remain free from injury will improve Outcome: Progressing   Problem: Skin Integrity: Goal: Risk for impaired skin integrity will decrease Outcome: Progressing

## 2023-10-01 NOTE — Progress Notes (Signed)
 Patient got confuse at 0200 and removed all the leads connected to her tele box.Tried to put it back and removed it again.Called CCMD

## 2023-10-03 ENCOUNTER — Encounter: Payer: Self-pay | Admitting: Obstetrics & Gynecology

## 2023-10-03 ENCOUNTER — Other Ambulatory Visit (HOSPITAL_COMMUNITY)
Admission: RE | Admit: 2023-10-03 | Discharge: 2023-10-03 | Disposition: A | Discharge status: Home / Self Care | Source: Ambulatory Visit | Attending: Obstetrics & Gynecology | Admitting: Obstetrics & Gynecology

## 2023-10-03 ENCOUNTER — Telehealth: Payer: Self-pay

## 2023-10-03 ENCOUNTER — Ambulatory Visit: Admitting: Obstetrics & Gynecology

## 2023-10-03 VITALS — BP 112/70 | HR 74

## 2023-10-03 DIAGNOSIS — C549 Malignant neoplasm of corpus uteri, unspecified: Secondary | ICD-10-CM | POA: Insufficient documentation

## 2023-10-03 NOTE — Progress Notes (Signed)
 GYNECOLOGIC ONCOLOGY NEW PATIENT CONSULTATION   Patient Name: Kelly [name]  Patient Age: 89 y.o. Date of Service: 10/04/23 Referring Provider: Dr. 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  I discussed with the patient, her daughter, and 2 sons who joined by phone today.  We reviewed recent workup including her imaging.  I reviewed CT images with the patient's daughter.  Based on CT images as well as my limited exam today, I suspect that findings represent a metastatic uterine cancer that has replaced the cervix/upper vagina and has spread to the adnexa.  We also discussed the possibility that this could represent metastatic cervical cancer.  Biopsy was performed yesterday by Dr. Inch.  These results are not available yet.  I will contact the family when I see these.  The patient is physically quite healthy but has significant dementia.  She was not able to participate very meaningfully in our conversation today.  Her symptoms include pain, although this has been more recent, and bleeding, which until the last week has been light.  We discussed typical treatment for metastatic uterine and cervical cancer.  I outlined treatment modalities that we may use including chemotherapy, radiation, and surgery.  The daughter voices that they would like to pursue palliation of her symptoms rather than aggressive therapy.  Whether this represents locally metastatic cervix or uterine cancer, I think the patient would benefit from palliative radiation to help decrease bleeding, and hopefully slow growth of her tumor.  This may also help with some of her pain.  The patient's family was open to meeting with one of our radiation oncologist to discuss palliative radiation.  I briefly reviewed that this would be given most likely in 10 fractions over a 2-week period.  The  patient was scheduled to see Dr. Shannon on 7/7.  The patient was monitored for approximately 20 minutes after packing was removed to ensure no heavy bleeding.  I reviewed precautions with the patient's daughter and recommended that the patient be brought to [facility] if she develops heavy vaginal bleeding so that we can help facilitate and expedite palliative radiation.  A copy of this note was sent to the patient's referring provider.   70 minutes of total time was spent for this patient encounter, including preparation, face-to-face counseling with the patient and coordination of care, and documentation of the encounter.  Comer Dollar, MD  Division of Gynecologic Oncology  Department of Obstetrics and Gynecology  [facility]  ___________________________________________  Chief Complaint: No chief complaint on file.   History of Present Illness:  Kelly [name] is a 89 y.o. y.o. female who is seen in consultation at the request of Dr. Inch for an evaluation of metastatic uterine vs cervical cancer.  Patient was recently admitted at the end of last week after developing severe lower abdominal pain.  Upon presentation to the emergency department, mildly hypotensive with a leukocytosis of 22 and lactic acid of 1.4.  CT of her abdomen pelvis was notable for diffusely abnormal morphology with enlargement of the cervix measuring up to 5.6 cm and a lobulated left upper pelvic/adnexal heterogenous mass measuring 10.2 x 8.8 cm.  The uterus itself had a diffusely abnormal morphology with soft tissue fullness throughout.  Small volume abdominopelvic ascites noted.  No well-defined omental or peritoneal disease.  No adenopathy.  Paracentesis was performed during her hospitalization on 6/21  with removal of 250 cc of peritoneal fluid.  This returned showing no malignant cells.  CA125 was obtained and noted to be elevated at 969.  Patient was empirically started on Rocephin   and metronidazole .  Per ID recommendations, she was discharged on Augmentin  for an additional 3 days.  Gynecology was consulted and recommended close outpatient follow-up.  Patient saw Dr. Jayne on 6/26 and had cervical biopsies performed.  Given quantity of vaginal bleeding, vagina was packed with Monsel soaked gauze.  Today, the patient comes in with her daughter, who provides much of her history.  Last Friday, before presenting to the emergency department, the patient developed some pain, which Marshall was initially thinking due to constipation.  When this did not improve, she took her mother into the emergency department.  By the time they arrived there, the patient's pain was gone.  Overall her appetite has been good, at baseline for her.  Denies any nausea or emesis.  Weight has been stable.  Patient has had increased fatigue over the last 6 months.  Her daughter notes that she dozes more frequently.  Several months ago, they would occasionally see a little blood in the patient's underwear or in her depends intermittently.  This increased summing frequency but not in quantity over the last several months.  The day they went home from the hospital, the patient had increased bleeding that had decreased by the following morning.  After the biopsy yesterday, the patient had minimal bleeding overnight, some pain last night for which she received Tylenol .   PAST MEDICAL HISTORY:  Past Medical History:  Diagnosis Date   Hypertension    Memory loss      PAST SURGICAL HISTORY:  Past Surgical History:  Procedure Laterality Date   INTRAMEDULLARY (IM) NAIL INTERTROCHANTERIC Left 09/18/2016   Procedure: INTRAMEDULLARY (IM) NAIL INTERTROCHANTRIC;  Surgeon: Fidel Rogue, MD;  Location: [facility];  Service: Orthopedics;  Laterality: Left;    OB/GYN HISTORY:  OB History  Gravida Para Term Preterm AB Living  4    1 3   SAB IAB Ectopic Multiple Live Births  1        # Outcome Date GA Lbr Len/2nd  Weight Sex Type Anes PTL Lv  4 Gravida      Vag-Spont     3 SAB           2 Gravida      Vag-Spont     1 Gravida      Vag-Spont       No LMP recorded. Patient is postmenopausal.  Age at menarche: unsure  Age at menopause: unsure Hx of HRT: yes Hx of STDs: denies Last pap: 2000 History of abnormal pap smears: denies  SCREENING STUDIES:  Last mammogram: 2018   MEDICATIONS: Outpatient Encounter Medications as of 10/04/2023  Medication Sig   amoxicillin -clavulanate (AUGMENTIN ) 875-125 MG tablet Take 1 tablet by mouth 2 (two) times daily for 3 days.   Cholecalciferol  25 MCG (1000 UT) tablet Take 1,000 Units by mouth daily.   losartan (COZAAR) 25 MG tablet Take 25 mg by mouth daily.   memantine  (NAMENDA ) 10 MG tablet Take 1 tablet (10 mg total) by mouth 2 (two) times daily.   Multiple Minerals-Vitamins (CALCIUM-MAGNESIUM-ZINC-D3 PO) Take 1 tablet by mouth daily.   No facility-administered encounter medications on file as of 10/04/2023.    ALLERGIES:  No Known Allergies   FAMILY HISTORY:  Family History  Problem Relation Age of Onset   Hypertension Mother  Heart failure Mother    Hearing loss Father    Aneurysm Father        brain   Breast cancer Neg Hx      SOCIAL HISTORY:  Social Connections: Unknown (09/28/2023)   Social Connection and Isolation Panel    Frequency of Communication with Friends and Family: More than three times a week    Frequency of Social Gatherings with Friends and Family: More than three times a week    Attends Religious Services: Not on Marketing executive or Organizations: Patient unable to answer    Attends Banker Meetings: Patient unable to answer    Marital Status: Not on file    REVIEW OF SYSTEMS:  + fatigue, bleeding Denies appetite changes, fevers, chills, unexplained weight changes. Denies hearing loss, neck lumps or masses, mouth sores, ringing in ears or voice changes. Denies cough or wheezing.  Denies  shortness of breath. Denies chest pain or palpitations. Denies leg swelling. Denies abdominal distention, pain, blood in stools, constipation, diarrhea, nausea, vomiting, or early satiety. Denies pain with intercourse, dysuria, frequency, hematuria or incontinence. Denies hot flashes, pelvic pain or vaginal discharge.   Denies joint pain, back pain or muscle pain/cramps. Denies itching, rash, or wounds. Denies dizziness, headaches, numbness or seizures. Denies swollen lymph nodes or glands, denies easy bruising or bleeding. Denies anxiety, depression, confusion, or decreased concentration.  Physical Exam:  Vital Signs for this encounter:  Blood pressure 114/60, pulse 67, temperature 98 F (36.7 C), resp. rate 17, height 5' 3.78 (1.62 m), weight 146 lb (66.2 kg), SpO2 97%. Body mass index is 25.23 kg/m. General: Alert, no acute distress.  Very pleasant, not oriented. HEENT: Normocephalic, atraumatic. Sclera anicteric.  Chest: Clear to auscultation bilaterally. No wheezes, rhonchi, or rales. Cardiovascular: Regular rate and rhythm, no murmurs, rubs, or gallops.  Abdomen: Normoactive bowel sounds. Soft, nondistended, nontender to palpation. No masses or hepatosplenomegaly appreciated. No palpable fluid wave.  Extremities: Grossly normal range of motion. Warm, well perfused. No edema bilaterally.  Skin: No rashes or lesions.  Lymphatics: No cervical, supraclavicular, or inguinal adenopathy.  GU:  Normal external female genitalia.  Some dried blood on external genitalia.  No lesions. Pelvic exam is somewhat limited due to the patient's discomfort. Two 4x4s that were quite adherent to the vagina secondary to Monsel's were removed with some difficulty using ring forceps.  Patient had considerable pain with this.  Some older blood noted although no active bleeding after removal of gauze.  On gentle bimanual exam, which was also poorly tolerated by the patient, there was a firm, very irregular mass  measuring approximately 6 cm that replaced the entire cervix.  Unable to appreciate parametrial involvement secondary to patient's discomfort.  LABORATORY AND RADIOLOGIC DATA:  Outside medical records were reviewed to synthesize the above history, along with the history and physical obtained during the visit.   Lab Results  Component Value Date   WBC 8.0 10/01/2023   HGB 12.5 10/01/2023   HCT 37.7 10/01/2023   PLT 318 10/01/2023   GLUCOSE 88 10/01/2023   ALT 14 10/01/2023   AST 20 10/01/2023   NA 142 10/01/2023   K 3.6 10/01/2023   CL 111 10/01/2023   CREATININE 0.84 10/01/2023   BUN 9 10/01/2023   CO2 23 10/01/2023

## 2023-10-03 NOTE — Telephone Encounter (Signed)
 Spoke with patient daughter and scheduled appointment for tomorrow 6/27 per Dr Viktoria.  Also contacted Dr Genie office at [phone] to let them know to cancel the appointment on 6/27 for this patient.  She will be seen in Dr Lewie office... appointment is scheduled for 12:00..patient daughter confirmed appointment

## 2023-10-04 ENCOUNTER — Ambulatory Visit: Admitting: Obstetrics & Gynecology

## 2023-10-04 ENCOUNTER — Encounter: Payer: Self-pay | Admitting: Oncology

## 2023-10-04 ENCOUNTER — Inpatient Hospital Stay: Attending: Gynecologic Oncology | Admitting: Gynecologic Oncology

## 2023-10-04 ENCOUNTER — Encounter: Payer: Self-pay | Admitting: Gynecologic Oncology

## 2023-10-04 VITALS — BP 114/60 | HR 67 | Temp 98.0°F | Resp 17 | Ht 63.78 in | Wt 146.0 lb

## 2023-10-04 DIAGNOSIS — I10 Essential (primary) hypertension: Secondary | ICD-10-CM | POA: Insufficient documentation

## 2023-10-04 DIAGNOSIS — C579 Malignant neoplasm of female genital organ, unspecified: Secondary | ICD-10-CM | POA: Insufficient documentation

## 2023-10-04 DIAGNOSIS — N888 Other specified noninflammatory disorders of cervix uteri: Secondary | ICD-10-CM

## 2023-10-04 DIAGNOSIS — R102 Pelvic and perineal pain: Secondary | ICD-10-CM | POA: Diagnosis not present

## 2023-10-04 DIAGNOSIS — F0390 Unspecified dementia without behavioral disturbance: Secondary | ICD-10-CM | POA: Insufficient documentation

## 2023-10-04 DIAGNOSIS — N858 Other specified noninflammatory disorders of uterus: Secondary | ICD-10-CM

## 2023-10-04 DIAGNOSIS — N950 Postmenopausal bleeding: Secondary | ICD-10-CM | POA: Diagnosis not present

## 2023-10-04 DIAGNOSIS — N9489 Other specified conditions associated with female genital organs and menstrual cycle: Secondary | ICD-10-CM

## 2023-10-04 DIAGNOSIS — R1900 Intra-abdominal and pelvic swelling, mass and lump, unspecified site: Secondary | ICD-10-CM

## 2023-10-04 DIAGNOSIS — Z79899 Other long term (current) drug therapy: Secondary | ICD-10-CM | POA: Insufficient documentation

## 2023-10-04 NOTE — Patient Instructions (Signed)
 It was very nice to meet you today.  Darice, our nurse navigator, is working on getting you set up to see Dr. Shannon, who is our radiation oncologist.  Based on the bleeding, I think the best treatment to help with palliation of symptoms would be radiation.  This would likely be given in 10 doses over a 2-week period.

## 2023-10-04 NOTE — Progress Notes (Signed)
 Follow up appointment  Hospital consult Pelvic mass  Chief Complaint  Patient presents with   Pelvic mass    Bleeding for 24 hours with clots right after discharge    Blood pressure 112/70, pulse 74.  Patient is seen in office today for follow-up from hospital I saw her 2 days prior as a consult from the hospitalist for pelvic mass Scan is quite complicated CA125 was significantly elevated at 969 Patient had 250 cc of ascites pulled off as well but the cytology was negative which essentially rules out ovarian malignancy On the scan there is no involvement of the paracervical parametrial tissue with the ureters being undisturbed which leads me now the think that this is uterine margin  Did a speculum exam today which was not well-tolerated The patient has a mass extending from the endometrium through the cervix It is not really a pedunculated myoma Is a fungating mass highly vascular which does not seem to involve the cervix primarily  I used a cervical biopsy to remove 2 pieces of tissue which I hope are viable and not necrotic The bleeding was immediate and brisk 2 vials of Monsel's was not enough to stop the bleeding I had to pack the vagina with impregnated Monsel's with two 4 x 4 Gauze  I let the patient sit for about 15 or 20 minutes and had no bleedthrough She will need to have her tube gauze removed tomorrow  I have secure chatted Dr. Viktoria and she is aware of my findings and my thought that this is a primary uterine process and I would guess given the patient's age and the clinical presentation most likely a sarcoma of course pending pathology  MEDS ordered this encounter: No orders of the defined types were placed in this encounter.   Orders for this encounter: No orders of the defined types were placed in this encounter.   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     All questions were answered.  Past Medical History:  Diagnosis Date   Hypertension    Memory loss     Past Surgical History:  Procedure Laterality Date   INTRAMEDULLARY (IM) NAIL INTERTROCHANTERIC Left 09/18/2016   Procedure: INTRAMEDULLARY (IM) NAIL INTERTROCHANTRIC;  Surgeon: Fidel Rogue, MD;  Location: [facility];  Service: Orthopedics;  Laterality: Left;    OB History     Gravida  4   Para      Term      Preterm      AB  1   Living         SAB  1   IAB      Ectopic      Multiple      Live Births              No Known Allergies  Social History   Socioeconomic History   Marital status: Divorced    Spouse name: Not on file   Number of children: 3   Years of education: college   Highest education level: Not on file  Occupational History   Not on file  Tobacco Use   Smoking status: Passive Smoke Exposure - Never Smoker   Smokeless tobacco: Never  Vaping Use   Vaping status: Never Used  Substance and  Sexual Activity   Alcohol  use: No   Drug use: Never   Sexual activity: Not Currently    Birth control/protection: None  Other Topics Concern   Not on file  Social History Narrative   Lives home alone.  Diivorced.  Education [employer].  Children 3, Marshall daughter with her today. 08-26-17.  Caffeine 2 cups daily.   Social Drivers of Corporate investment banker Strain: Low Risk  (10/03/2023)   Overall Financial Resource Strain (CARDIA)    Difficulty of Paying Living Expenses: Not hard at all  Food Insecurity: Low Risk  (10/02/2023)   Received from [facility]   Hunger Vital Sign    Within the past 12 months, you worried that your food would run out before you got money to buy more: Never true    Within the past 12 months, the food you bought just didn't last and you didn't have money to get more. : Never true  Transportation Needs:  No Transportation Needs (10/02/2023)   Received from [facility]    In the past 12 months, has lack of reliable transportation kept you from medical appointments, meetings, work or from getting things needed for daily living? : No  Physical Activity: Inactive (10/03/2023)   Exercise Vital Sign    Days of Exercise per Week: 0 days    Minutes of Exercise per Session: 0 min  Stress: Not on file  Social Connections: Unknown (09/28/2023)   Social Connection and Isolation Panel    Frequency of Communication with Friends and Family: More than three times a week    Frequency of Social Gatherings with Friends and Family: More than three times a week    Attends Religious Services: Not on Marketing executive or Organizations: Patient unable to answer    Attends Banker Meetings: Patient unable to answer    Marital Status: Not on file    Family History  Problem Relation Age of Onset   Hypertension Mother    Heart failure Mother    Hearing loss Father    Aneurysm Father        brain   Breast cancer Neg Hx

## 2023-10-04 NOTE — Progress Notes (Signed)
 Referral placed to Radiation Oncology per Dr. Viktoria.

## 2023-10-09 NOTE — Progress Notes (Signed)
 GYN Location of Tumor / Histology: Cervical  Kelly [name] presented with symptoms of: bleeding  Biopsies revealed:   Past/Anticipated interventions by Gyn/Onc surgery, if any:    Past/Anticipated interventions by medical oncology, if any: None  Weight changes, if any: no  Bowel/Bladder complaints, if any: No. Intermittent urgency with bowels.   Nausea/Vomiting, if any: no  Pain issues, if any:  no  SAFETY ISSUES: Prior radiation? no Pacemaker/ICD? no Possible current pregnancy? no Is the patient on methotrexate? no  Current Complaints / other details:  Bleeding has slowed down.   BP (P) 119/74 (BP Location: Left Arm, Patient Position: Sitting)   Pulse (P) 64   Temp (!) (P) 97.5 F (36.4 C) (Temporal)   Resp (P) 18   Ht (P) 5' 3.75 (1.619 m)   Wt (P) 143 lb 2 oz (64.9 kg)   SpO2 (P) 98%   BMI (P) 24.76 kg/m

## 2023-10-13 NOTE — Progress Notes (Signed)
 Radiation Oncology         [phone] ________________________________  Initial Outpatient Consultation  Name: Kelly [name] MRN: [id number]  Date: 10/14/2023  DOB: 03-Nov-1934  RR:Xjeojw, Josette ORN., PA-C  Viktoria Comer SAUNDERS, MD   REFERRING PHYSICIAN: Viktoria Comer SAUNDERS, MD  DIAGNOSIS: There were no encounter diagnoses.  FIGO grade 2 endometrial endometrioid carcinoma; ER/PR+; p53 wild-type.   HISTORY OF PRESENT ILLNESS::Kelly [name] is a 89 y.o. female who is accompanied by ***. she is seen as a courtesy of Dr. Viktoria for an opinion concerning radiation therapy as part of management for her recently diagnosed endometrial cancer. She also has significant dementia which impairs her decision making and functional capabilities.    She presented to the ED from her assisted living facility on 09/27/23 with abdominal pain. Labs collected in the ED were significant for leukocytosis of 22 and lactic acid 1.4 and urinalysis results were also concerning for UTI. A CT AP was accordingly performed which showed: a diffusely abnormal appearing uterus which appeared contiguous with a left pelvic/adnexal dominant mass measuring approximately 10.2 x 8.8 cm, favoring a cervical/uterine primary with extension into the left adnexa vs. synchronous cervical/uterine and ovarian primaries. Other findings of potential clinical significance also included small volume abdominopelvic ascites (without evidence of peritoneal metastasis), a moderate hiatal hernia, and cholelithiasis.   Of note: an abdominal US  was also performed on 06/20 which did not demonstrate the left pelvic mass. US  findings otherwise included cholelithiasis, fatty infiltration of the liver, and trace perihepatic ascites.  She was accordingly admitted for further management based on her ED workup and started on empiric antibiotics (rocephin  and metronidazole ).   OB/GYN was also consulted upon admission and she underwent paracentesis on 06/21 for  management of ascites which yielded approximately 250 mL of hazy light brown colored fluid. Fluid was sent for cytology and showed no evidence of malignancy. However, further labs were ordered which showed a CA 125 of > 969. ID was accordingly consulted and she was discharged on a 3 day course of Augmentin  and arranged for OP GYN management.   She was accordingly seen by Dr. Jayne (GYN) and had a pelvic exam on 10/03/23 which showed a mass extending from the endometrium through the cervix (though not involving the cervix primarily). The mass also appeared fungating and highly vascular.   Biopsies of the mass were obtained by Dr. Jayne at that time and showed findings findings consisted with FIGO grade 2 endometrial endometrioid carcinoma; ER/PR+; p53 wild-type. (Of note: the patient experienced significant bleeding from her biopsies).   She was then subsequently referred to Gyn-Onc and was seen in consultation by Dr. Viktoria on 10/04/23. Treatment options including chemotherapy, radiation, and surgery were discussed with the patient and her daughter at that time. The patient's daughter has however favored pursuing a palliative approach to therapy. In light of this preference, Dr. Viktoria as recommended a course of palliative radiation therapy to help decrease her bleeding and to hopefully slow the growth of her tumor.     PREVIOUS RADIATION THERAPY: No  PAST MEDICAL HISTORY:  Past Medical History:  Diagnosis Date   Hypertension    Memory loss     PAST SURGICAL HISTORY: Past Surgical History:  Procedure Laterality Date   INTRAMEDULLARY (IM) NAIL INTERTROCHANTERIC Left 09/18/2016   Procedure: INTRAMEDULLARY (IM) NAIL INTERTROCHANTRIC;  Surgeon: Fidel Rogue, MD;  Location: [facility];  Service: Orthopedics;  Laterality: Left;    FAMILY HISTORY:  Family History  Problem Relation Age of  Onset   Hypertension Mother    Heart failure Mother    Hearing loss Father    Aneurysm Father        brain    Breast cancer Neg Hx     SOCIAL HISTORY:  Social History   Tobacco Use   Smoking status: Passive Smoke Exposure - Never Smoker   Smokeless tobacco: Never  Vaping Use   Vaping status: Never Used  Substance Use Topics   Alcohol  use: No   Drug use: Never    ALLERGIES: No Known Allergies  MEDICATIONS:  Current Outpatient Medications  Medication Sig Dispense Refill   Cholecalciferol  25 MCG (1000 UT) tablet Take 1,000 Units by mouth daily.     losartan (COZAAR) 25 MG tablet Take 25 mg by mouth daily.  11   memantine  (NAMENDA ) 10 MG tablet Take 1 tablet (10 mg total) by mouth 2 (two) times daily. 180 tablet 4   Multiple Minerals-Vitamins (CALCIUM-MAGNESIUM-ZINC-D3 PO) Take 1 tablet by mouth daily.     No current facility-administered medications for this encounter.    REVIEW OF SYSTEMS:  A 10+ POINT REVIEW OF SYSTEMS WAS OBTAINED including neurology, dermatology, psychiatry, cardiac, respiratory, lymph, extremities, GI, GU, musculoskeletal, constitutional, reproductive, HEENT. ***   PHYSICAL EXAM:  vitals were not taken for this visit.   General: Alert and oriented, in no acute distress HEENT: Head is normocephalic. Extraocular movements are intact. Oropharynx is clear. Neck: Neck is supple, no palpable cervical or supraclavicular lymphadenopathy. Heart: Regular in rate and rhythm with no murmurs, rubs, or gallops. Chest: Clear to auscultation bilaterally, with no rhonchi, wheezes, or rales. Abdomen: Soft, nontender, nondistended, with no rigidity or guarding. Extremities: No cyanosis or edema. Lymphatics: see Neck Exam Skin: No concerning lesions. Musculoskeletal: symmetric strength and muscle tone throughout. Neurologic: Cranial nerves II through XII are grossly intact. No obvious focalities. Speech is fluent. Coordination is intact. Psychiatric: Judgment and insight are intact. Affect is appropriate.  On pelvic examination the external genitalia were unremarkable. A speculum  exam was performed. There are no mucosal lesions noted in the vaginal vault. A Pap smear was obtained of the proximal vagina. On bimanual and rectovaginal examination there were no pelvic masses appreciated. ***   ECOG = ***  0 - Asymptomatic (Fully active, able to carry on all predisease activities without restriction)  1 - Symptomatic but completely ambulatory (Restricted in physically strenuous activity but ambulatory and able to carry out work of a light or sedentary nature. For example, light housework, office work)  2 - Symptomatic, <50% in bed during the day (Ambulatory and capable of all self care but unable to carry out any work activities. Up and about more than 50% of waking hours)  3 - Symptomatic, >50% in bed, but not bedbound (Capable of only limited self-care, confined to bed or chair 50% or more of waking hours)  4 - Bedbound (Completely disabled. Cannot carry on any self-care. Totally confined to bed or chair)  5 - Death   Raylene MM, Creech RH, Tormey DC, et al. [phone]). Toxicity and response criteria of the [employer]. Am. DOROTHA Bridges. Oncol. 5 (6): 649-55  LABORATORY DATA:  Lab Results  Component Value Date   WBC 8.0 10/01/2023   HGB 12.5 10/01/2023   HCT 37.7 10/01/2023   MCV 96.9 10/01/2023   PLT 318 10/01/2023   NEUTROABS 19.1 (H) 09/17/2016   Lab Results  Component Value Date   NA 142 10/01/2023   K 3.6 10/01/2023  CL 111 10/01/2023   CO2 23 10/01/2023   GLUCOSE 88 10/01/2023   BUN 9 10/01/2023   CREATININE 0.84 10/01/2023   CALCIUM 8.7 (L) 10/01/2023      RADIOGRAPHY: US  Paracentesis Result Date: 09/28/2023 INDICATION: 89 year old female with recent finding of pelvic mass and ascites. Request for therapeutic and diagnostic paracentesis. EXAM: ULTRASOUND GUIDED  PARACENTESIS MEDICATIONS: 10 mL 1% lidocaine  COMPLICATIONS: None immediate. PROCEDURE: Informed written consent was obtained from the patient and her daughter after a  discussion of the risks, benefits and alternatives to treatment. A timeout was performed prior to the initiation of the procedure. Initial ultrasound scanning demonstrates a small amount of ascites within the right lower abdominal quadrant. The right lower abdomen was prepped and draped in the usual sterile fashion. 1% lidocaine  was used for local anesthesia. Following this, a 19 gauge, 7-cm, Yueh catheter was introduced. An ultrasound image was saved for documentation purposes. The paracentesis was performed. The catheter was removed and a dressing was applied. The patient tolerated the procedure well without immediate post procedural complication. FINDINGS: A total of approximately 250 mL of hazy, light brown colored fluid was removed. Samples were sent to the laboratory as requested by the clinical team. IMPRESSION: Successful ultrasound-guided paracentesis yielding 250 mL of peritoneal fluid. Performed by: Aimee Han, PA-C Electronically Signed   By: CHRISTELLA.  Shick M.D.   On: 09/28/2023 15:31   US  Abdomen Limited RUQ (LIVER/GB) Result Date: 09/27/2023 CLINICAL DATA:  151471 RUQ pain 151471 EXAM: ULTRASOUND ABDOMEN LIMITED RIGHT UPPER QUADRANT COMPARISON:  None Available. FINDINGS: Gallbladder: Several shadowing gallstones. No pericholecystic fluid or wall thickening. Common bile duct: Diameter: 0.2 cm. Liver: Liver is hyperechoic consistent with fatty infiltration. No focal hepatic lesions identified. No intrahepatic ductal dilatation. Hepatopetal portal vein flow. Trace perihepatic ascites. IMPRESSION: 1. Cholelithiasis. 2. Fatty infiltration of the liver. 3. Trace perihepatic ascites. Electronically Signed   By: Fonda Field M.D.   On: 09/27/2023 20:08   CT ABDOMEN PELVIS W CONTRAST Result Date: 09/27/2023 CLINICAL DATA:  Generalized abdominal pain. * Tracking Code: BO * EXAM: CT ABDOMEN AND PELVIS WITH CONTRAST TECHNIQUE: Multidetector CT imaging of the abdomen and pelvis was performed using the standard  protocol following bolus administration of intravenous contrast. RADIATION DOSE REDUCTION: This exam was performed according to the departmental dose-optimization program which includes automated exposure control, adjustment of the mA and/or kV according to patient size and/or use of iterative reconstruction technique. CONTRAST:  OMNIPAQUE  IOHEXOL  300 MG/ML  SOLN COMPARISON:  None Available. FINDINGS: Lower chest: Bibasilar scarring mild cardiomegaly, without pericardial or pleural effusion. Moderate hiatal hernia. Hepatobiliary: Normal liver. Multiple gallstones on the order of 6 mm without acute cholecystitis or biliary duct dilatation. Pancreas: Normal, without mass or ductal dilatation. Spleen: Normal in size, without focal abnormality. Adrenals/Urinary Tract: Normal adrenal glands. Left renal too small to characterize lesions are most likely cysts . In the absence of clinically indicated signs/symptoms require(s) no independent follow-up. Normal right kidney. No hydronephrosis. Normal urinary bladder. Stomach/Bowel: Normal remainder of the stomach. Normal colon and terminal ileum. Appendix not visualized. Normal small bowel. Vascular/Lymphatic: Aortic atherosclerosis. No abdominopelvic adenopathy. Reproductive: The uterus has a diffusely abnormal morphology, with soft tissue fullness throughout. Example cervical enlargement at 5.5 x 5.6 cm on 73/3. This is contiguous with a macrolobulated left upper pelvic/adnexal heterogeneous mass of 10.2 x 8.8 cm on 62/3. Other: Pelvic floor laxity. Small volume abdominopelvic ascites. No well-defined omental/peritoneal metastasis. Musculoskeletal: Osteopenia. Left proximal femur fixation. Grade 1 L4-5 anterolisthesis.  Mild convex left lumbar spine curvature. IMPRESSION: 1. Diffusely abnormal appearance of the uterus, contiguous with a left pelvic/adnexal dominant mass. Findings are favored to represent either cervical/uterine primary with extension into the left  adnexa or synchronous cervical/uterine and ovarian primaries. Recommend gynecological consultation. 2. Small volume abdominopelvic ascites, without evidence of peritoneal metastasis. 3. No bowel obstruction or other acute complication. 4. Incidental findings, including: Moderate hiatal hernia. Cholelithiasis. Pelvic floor laxity. Electronically Signed   By: Rockey Kilts M.D.   On: 09/27/2023 18:27      IMPRESSION: FIGO grade 2 endometrial endometrioid carcinoma; ER/PR+; p53 wild-type.   ***  Today, I talked to the patient and family about the findings and work-up thus far.  We discussed the natural history of *** and general treatment, highlighting the role of radiotherapy in the management.  We discussed the available radiation techniques, and focused on the details of logistics and delivery.  We reviewed the anticipated acute and late sequelae associated with radiation in this setting.  The patient was encouraged to ask questions that I answered to the best of my ability. *** A patient consent form was discussed and signed.  We retained a copy for our records.  The patient would like to proceed with radiation and will be scheduled for CT simulation.  PLAN: ***    *** minutes of total time was spent for this patient encounter, including preparation, face-to-face counseling with the patient and coordination of care, physical exam, and documentation of the encounter.   ------------------------------------------------  Lynwood CHARM Nasuti, PhD, MD  This document serves as a record of services personally performed by Lynwood Nasuti, MD. It was created on his behalf by Dorthy Fuse, a trained medical scribe. The creation of this record is based on the scribe's personal observations and the provider's statements to them. This document has been checked and approved by the attending provider.

## 2023-10-14 ENCOUNTER — Encounter: Payer: Self-pay | Admitting: Radiation Oncology

## 2023-10-14 ENCOUNTER — Ambulatory Visit
Admission: RE | Admit: 2023-10-14 | Discharge: 2023-10-14 | Disposition: A | Discharge status: Home / Self Care | Source: Ambulatory Visit | Attending: Radiation Oncology | Admitting: Radiation Oncology

## 2023-10-14 DIAGNOSIS — C541 Malignant neoplasm of endometrium: Secondary | ICD-10-CM

## 2023-10-14 DIAGNOSIS — C579 Malignant neoplasm of female genital organ, unspecified: Secondary | ICD-10-CM

## 2023-10-14 DIAGNOSIS — Z510 Encounter for antineoplastic radiation therapy: Secondary | ICD-10-CM | POA: Insufficient documentation

## 2023-10-15 ENCOUNTER — Encounter: Payer: Self-pay | Admitting: Gynecologic Oncology

## 2023-10-15 NOTE — Progress Notes (Signed)
 I called the patient's daughter to check in, discussed biopsy results. No answer. Left voicemail with callback request requested.  MARLA Dollar MD

## 2023-10-16 ENCOUNTER — Ambulatory Visit
Admission: RE | Admit: 2023-10-16 | Discharge: 2023-10-16 | Discharge status: Home / Self Care | Source: Ambulatory Visit | Attending: Radiation Oncology | Admitting: Radiation Oncology

## 2023-10-16 DIAGNOSIS — C579 Malignant neoplasm of female genital organ, unspecified: Secondary | ICD-10-CM

## 2023-10-16 DIAGNOSIS — Z510 Encounter for antineoplastic radiation therapy: Secondary | ICD-10-CM | POA: Diagnosis not present

## 2023-10-17 ENCOUNTER — Telehealth: Payer: Self-pay | Admitting: Gynecologic Oncology

## 2023-10-17 NOTE — Telephone Encounter (Signed)
 I called the patient's daughter again, no answer.  Left voicemail requesting callback.  Comer Dollar MD Gynecologic Oncology

## 2023-10-21 ENCOUNTER — Other Ambulatory Visit: Payer: Self-pay | Admitting: Oncology

## 2023-10-21 NOTE — Progress Notes (Signed)
 Gynecologic Oncology Multi-Disciplinary Disposition Conference Note  Date of the Conference: 10/21/2023  Patient Name: Kelly [name]  Referring Provider: Dr. Jayne Primary GYN Oncologist: Dr. Viktoria   Stage/Disposition:  Suspected stage III endometrial endometrioid carcinoma. Disposition is to palliative radiation.   This Multidisciplinary conference took place involving physicians from Gynecologic Oncology, Medical Oncology, Radiation Oncology, Pathology, Radiology along with the Gynecologic Oncology Nurse Practitioner and Gynecologic Oncology Nurse Navigator.  Comprehensive assessment of the patient's malignancy, staging, need for surgery, chemotherapy, radiation therapy, and need for further testing were reviewed. Supportive measures, both inpatient and following discharge were also discussed. The recommended plan of care is documented. Greater than 35 minutes were spent correlating and coordinating this patient's care.

## 2023-10-22 ENCOUNTER — Telehealth: Payer: Self-pay | Admitting: *Deleted

## 2023-10-22 DIAGNOSIS — Z510 Encounter for antineoplastic radiation therapy: Secondary | ICD-10-CM | POA: Diagnosis not present

## 2023-10-22 NOTE — Telephone Encounter (Signed)
 Spoke with patient's daughter Marshall Ana who called the office wanting to speak with Dr. Viktoria. Marshall states she spoke with Dr. Viktoria recently and she has more questions but they are not urgent and would like for Dr. Viktoria to call her when she is available next. Myles Marshall that her message will be relayed to provider. Marshall thanked the office.

## 2023-10-24 ENCOUNTER — Other Ambulatory Visit: Payer: Self-pay

## 2023-10-24 ENCOUNTER — Ambulatory Visit
Admission: RE | Admit: 2023-10-24 | Discharge: 2023-10-24 | Disposition: A | Discharge status: Home / Self Care | Source: Ambulatory Visit | Attending: Radiation Oncology | Admitting: Radiation Oncology

## 2023-10-24 DIAGNOSIS — Z510 Encounter for antineoplastic radiation therapy: Secondary | ICD-10-CM | POA: Diagnosis not present

## 2023-10-24 LAB — RAD ONC ARIA SESSION SUMMARY
Course Elapsed Days: 0
Plan Fractions Treated to Date: 1
Plan Prescribed Dose Per Fraction: 3 Gy
Plan Total Fractions Prescribed: 10
Plan Total Prescribed Dose: 30 Gy
Reference Point Dosage Given to Date: 3 Gy
Reference Point Session Dosage Given: 3 Gy
Session Number: 1

## 2023-10-25 ENCOUNTER — Other Ambulatory Visit: Payer: Self-pay

## 2023-10-25 ENCOUNTER — Telehealth: Payer: Self-pay | Admitting: Oncology

## 2023-10-25 ENCOUNTER — Ambulatory Visit
Admission: RE | Admit: 2023-10-25 | Discharge: 2023-10-25 | Disposition: A | Discharge status: Home / Self Care | Source: Ambulatory Visit | Attending: Radiation Oncology | Admitting: Radiation Oncology

## 2023-10-25 DIAGNOSIS — Z510 Encounter for antineoplastic radiation therapy: Secondary | ICD-10-CM | POA: Diagnosis not present

## 2023-10-25 LAB — RAD ONC ARIA SESSION SUMMARY
Course Elapsed Days: 1
Plan Fractions Treated to Date: 2
Plan Prescribed Dose Per Fraction: 3 Gy
Plan Total Fractions Prescribed: 10
Plan Total Prescribed Dose: 30 Gy
Reference Point Dosage Given to Date: 6 Gy
Reference Point Session Dosage Given: 3 Gy
Session Number: 2

## 2023-10-25 LAB — SURGICAL PATHOLOGY

## 2023-10-25 NOTE — Telephone Encounter (Signed)
 Called Marshall (daughter) and discussed options for hospice care.  Advised her that we would refer to [facility] or [facility] since her mom lives in [place].  Marshall said they are still trying to decide and will discuss with her brothers next week and will call back if they would like us  to call in a referral.

## 2023-10-28 ENCOUNTER — Ambulatory Visit
Admission: RE | Admit: 2023-10-28 | Discharge: 2023-10-28 | Disposition: A | Discharge status: Home / Self Care | Source: Ambulatory Visit | Attending: Radiation Oncology | Admitting: Radiation Oncology

## 2023-10-28 ENCOUNTER — Other Ambulatory Visit: Payer: Self-pay

## 2023-10-28 DIAGNOSIS — Z510 Encounter for antineoplastic radiation therapy: Secondary | ICD-10-CM | POA: Diagnosis not present

## 2023-10-28 LAB — RAD ONC ARIA SESSION SUMMARY
Course Elapsed Days: 4
Plan Fractions Treated to Date: 3
Plan Prescribed Dose Per Fraction: 3 Gy
Plan Total Fractions Prescribed: 10
Plan Total Prescribed Dose: 30 Gy
Reference Point Dosage Given to Date: 9 Gy
Reference Point Session Dosage Given: 3 Gy
Session Number: 3

## 2023-10-29 ENCOUNTER — Ambulatory Visit
Admission: RE | Admit: 2023-10-29 | Discharge: 2023-10-29 | Disposition: A | Discharge status: Home / Self Care | Source: Ambulatory Visit | Attending: Radiation Oncology | Admitting: Radiation Oncology

## 2023-10-29 ENCOUNTER — Other Ambulatory Visit: Payer: Self-pay

## 2023-10-29 DIAGNOSIS — Z510 Encounter for antineoplastic radiation therapy: Secondary | ICD-10-CM | POA: Diagnosis not present

## 2023-10-29 LAB — RAD ONC ARIA SESSION SUMMARY
Course Elapsed Days: 5
Plan Fractions Treated to Date: 4
Plan Prescribed Dose Per Fraction: 3 Gy
Plan Total Fractions Prescribed: 10
Plan Total Prescribed Dose: 30 Gy
Reference Point Dosage Given to Date: 12 Gy
Reference Point Session Dosage Given: 3 Gy
Session Number: 4

## 2023-10-30 ENCOUNTER — Ambulatory Visit

## 2023-10-30 ENCOUNTER — Other Ambulatory Visit: Payer: Self-pay

## 2023-10-31 ENCOUNTER — Other Ambulatory Visit: Payer: Self-pay

## 2023-10-31 ENCOUNTER — Ambulatory Visit
Admission: RE | Admit: 2023-10-31 | Discharge: 2023-10-31 | Disposition: A | Discharge status: Home / Self Care | Source: Ambulatory Visit | Attending: Radiation Oncology | Admitting: Radiation Oncology

## 2023-10-31 DIAGNOSIS — Z510 Encounter for antineoplastic radiation therapy: Secondary | ICD-10-CM | POA: Diagnosis not present

## 2023-10-31 LAB — RAD ONC ARIA SESSION SUMMARY
Course Elapsed Days: 7
Plan Fractions Treated to Date: 5
Plan Prescribed Dose Per Fraction: 3 Gy
Plan Total Fractions Prescribed: 10
Plan Total Prescribed Dose: 30 Gy
Reference Point Dosage Given to Date: 15 Gy
Reference Point Session Dosage Given: 3 Gy
Session Number: 5

## 2023-11-01 ENCOUNTER — Ambulatory Visit
Admission: RE | Admit: 2023-11-01 | Discharge: 2023-11-01 | Disposition: A | Discharge status: Home / Self Care | Source: Ambulatory Visit | Attending: Radiation Oncology | Admitting: Radiation Oncology

## 2023-11-01 ENCOUNTER — Other Ambulatory Visit: Payer: Self-pay

## 2023-11-01 DIAGNOSIS — Z510 Encounter for antineoplastic radiation therapy: Secondary | ICD-10-CM | POA: Diagnosis not present

## 2023-11-01 LAB — RAD ONC ARIA SESSION SUMMARY
Course Elapsed Days: 8
Plan Fractions Treated to Date: 6
Plan Prescribed Dose Per Fraction: 3 Gy
Plan Total Fractions Prescribed: 10
Plan Total Prescribed Dose: 30 Gy
Reference Point Dosage Given to Date: 18 Gy
Reference Point Session Dosage Given: 3 Gy
Session Number: 6

## 2023-11-04 ENCOUNTER — Other Ambulatory Visit: Payer: Self-pay

## 2023-11-04 ENCOUNTER — Ambulatory Visit
Admission: RE | Admit: 2023-11-04 | Discharge: 2023-11-04 | Disposition: A | Discharge status: Home / Self Care | Source: Ambulatory Visit | Attending: Radiation Oncology | Admitting: Radiation Oncology

## 2023-11-04 DIAGNOSIS — Z510 Encounter for antineoplastic radiation therapy: Secondary | ICD-10-CM | POA: Diagnosis not present

## 2023-11-04 LAB — RAD ONC ARIA SESSION SUMMARY
Course Elapsed Days: 11
Plan Fractions Treated to Date: 7
Plan Prescribed Dose Per Fraction: 3 Gy
Plan Total Fractions Prescribed: 10
Plan Total Prescribed Dose: 30 Gy
Reference Point Dosage Given to Date: 21 Gy
Reference Point Session Dosage Given: 3 Gy
Session Number: 7

## 2023-11-05 ENCOUNTER — Ambulatory Visit

## 2023-11-05 ENCOUNTER — Ambulatory Visit
Admission: RE | Admit: 2023-11-05 | Discharge: 2023-11-05 | Disposition: A | Discharge status: Home / Self Care | Source: Ambulatory Visit | Attending: Radiation Oncology | Admitting: Radiation Oncology

## 2023-11-05 ENCOUNTER — Other Ambulatory Visit: Payer: Self-pay

## 2023-11-05 DIAGNOSIS — Z510 Encounter for antineoplastic radiation therapy: Secondary | ICD-10-CM | POA: Diagnosis not present

## 2023-11-05 LAB — RAD ONC ARIA SESSION SUMMARY
Course Elapsed Days: 12
Plan Fractions Treated to Date: 8
Plan Prescribed Dose Per Fraction: 3 Gy
Plan Total Fractions Prescribed: 10
Plan Total Prescribed Dose: 30 Gy
Reference Point Dosage Given to Date: 24 Gy
Reference Point Session Dosage Given: 3 Gy
Session Number: 8

## 2023-11-06 ENCOUNTER — Ambulatory Visit

## 2023-11-06 ENCOUNTER — Other Ambulatory Visit: Payer: Self-pay

## 2023-11-06 ENCOUNTER — Ambulatory Visit
Admission: RE | Admit: 2023-11-06 | Discharge: 2023-11-06 | Disposition: A | Discharge status: Home / Self Care | Source: Ambulatory Visit | Attending: Radiation Oncology | Admitting: Radiation Oncology

## 2023-11-06 DIAGNOSIS — Z510 Encounter for antineoplastic radiation therapy: Secondary | ICD-10-CM | POA: Diagnosis not present

## 2023-11-06 LAB — RAD ONC ARIA SESSION SUMMARY
Course Elapsed Days: 13
Plan Fractions Treated to Date: 9
Plan Prescribed Dose Per Fraction: 3 Gy
Plan Total Fractions Prescribed: 10
Plan Total Prescribed Dose: 30 Gy
Reference Point Dosage Given to Date: 27 Gy
Reference Point Session Dosage Given: 3 Gy
Session Number: 9

## 2023-11-07 ENCOUNTER — Ambulatory Visit

## 2023-11-08 ENCOUNTER — Encounter: Payer: Self-pay | Admitting: Radiation Oncology

## 2023-11-08 ENCOUNTER — Ambulatory Visit

## 2023-11-11 ENCOUNTER — Ambulatory Visit

## 2023-11-11 ENCOUNTER — Telehealth: Payer: Self-pay | Admitting: Radiation Oncology

## 2023-11-11 NOTE — Telephone Encounter (Signed)
 Annalysa Osterberg's daughter called to cx today's 3:15pm appt. Daughter advised pt is weak and not able to come for tx. Message routed to provider, daughter is asking if r/s is necessary since this was palliative and pt is now on hospice care. Daughter was advised message would be routed to Dr. Shannon and nurses to c/b and let her know. She also asked for 8/28 f/u to be cx due to hospice care.  Notified L3 and Support RTT via email of today's cancellation.

## 2023-11-12 ENCOUNTER — Ambulatory Visit

## 2023-11-12 ENCOUNTER — Telehealth: Payer: Self-pay

## 2023-11-12 NOTE — Telephone Encounter (Signed)
 Called daughter to make aware that Dr. Shannon agreed to cancel patients last radiation treatment. Per daughter family has decided to call in hospice and requesting all upcoming appointments be canceled.

## 2023-11-13 NOTE — Radiation Completion Notes (Signed)
  Radiation Oncology         [phone] ________________________________  Name: Kelly [name] MRN: [id number]  Date of Service: 11/06/2023  DOB: 09/21/1934  End of Treatment Note  Diagnosis: FIGO grade 2 endometrial endometrioid carcinoma; ER/PR+; p53 wild-type.  Intent: Palliative     ==========DELIVERED PLANS==========  First Treatment Date: 2023-10-21 Last Treatment Date: 2023-11-06   Plan Name: Pelvis Site: Uterus Technique: 3D Mode: Photon Dose Per Fraction: 3 Gy Prescribed Dose (Delivered / Prescribed): 27 Gy / 30 Gy Prescribed Fxs (Delivered / Prescribed): 9 / 10     ====================================   The patient tolerated radiation well. After her ninth treatment, the patient decided to transition the patient to hospice care. We appreciate the opportunity to take part in this patient's care. Radiation follow up PRN at this time.       Ronita Due, PA-C

## 2023-12-05 ENCOUNTER — Ambulatory Visit: Payer: Self-pay | Admitting: Radiation Oncology
